# Patient Record
Sex: Male | Born: 1985 | Race: White | Hispanic: No | Marital: Single | Smoking: Current every day smoker
Health system: Southern US, Community
[De-identification: ages and names within clinical notes are randomized; demographics above are authoritative.]

## PROBLEM LIST (undated history)

## (undated) DIAGNOSIS — F329 Major depressive disorder, single episode, unspecified: Secondary | ICD-10-CM

## (undated) DIAGNOSIS — F1010 Alcohol abuse, uncomplicated: Secondary | ICD-10-CM

## (undated) DIAGNOSIS — M5136 Other intervertebral disc degeneration, lumbar region: Secondary | ICD-10-CM

## (undated) DIAGNOSIS — F909 Attention-deficit hyperactivity disorder, unspecified type: Secondary | ICD-10-CM

## (undated) DIAGNOSIS — T1491XA Suicide attempt, initial encounter: Secondary | ICD-10-CM

## (undated) DIAGNOSIS — F32A Depression, unspecified: Secondary | ICD-10-CM

## (undated) DIAGNOSIS — F319 Bipolar disorder, unspecified: Secondary | ICD-10-CM

## (undated) DIAGNOSIS — F1920 Other psychoactive substance dependence, uncomplicated: Secondary | ICD-10-CM

## (undated) DIAGNOSIS — F1910 Other psychoactive substance abuse, uncomplicated: Secondary | ICD-10-CM

---
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
## 2011-01-31 ENCOUNTER — Emergency Department (HOSPITAL_COMMUNITY)
Admission: EM | Admit: 2011-01-31 | Discharge: 2011-01-31 | Disposition: A | Discharge status: Home / Self Care | Payer: Self-pay | Attending: Emergency Medicine | Admitting: Emergency Medicine

## 2011-01-31 DIAGNOSIS — S01501A Unspecified open wound of lip, initial encounter: Secondary | ICD-10-CM | POA: Insufficient documentation

## 2011-01-31 DIAGNOSIS — W1809XA Striking against other object with subsequent fall, initial encounter: Secondary | ICD-10-CM | POA: Insufficient documentation

## 2012-02-14 ENCOUNTER — Encounter (HOSPITAL_COMMUNITY): Payer: Self-pay | Admitting: *Deleted

## 2012-02-14 ENCOUNTER — Emergency Department (HOSPITAL_COMMUNITY)
Admission: EM | Admit: 2012-02-14 | Discharge: 2012-02-14 | Disposition: A | Discharge status: Home / Self Care | Payer: Self-pay | Attending: Emergency Medicine | Admitting: Emergency Medicine

## 2012-02-14 ENCOUNTER — Emergency Department (HOSPITAL_COMMUNITY): Payer: Self-pay

## 2012-02-14 DIAGNOSIS — R059 Cough, unspecified: Secondary | ICD-10-CM | POA: Insufficient documentation

## 2012-02-14 DIAGNOSIS — R05 Cough: Secondary | ICD-10-CM | POA: Insufficient documentation

## 2012-02-14 DIAGNOSIS — R079 Chest pain, unspecified: Secondary | ICD-10-CM | POA: Insufficient documentation

## 2012-02-14 DIAGNOSIS — J069 Acute upper respiratory infection, unspecified: Secondary | ICD-10-CM | POA: Insufficient documentation

## 2012-02-14 DIAGNOSIS — R070 Pain in throat: Secondary | ICD-10-CM | POA: Insufficient documentation

## 2012-02-14 DIAGNOSIS — J3489 Other specified disorders of nose and nasal sinuses: Secondary | ICD-10-CM | POA: Insufficient documentation

## 2012-02-14 DIAGNOSIS — F17200 Nicotine dependence, unspecified, uncomplicated: Secondary | ICD-10-CM | POA: Insufficient documentation

## 2012-02-14 DIAGNOSIS — IMO0001 Reserved for inherently not codable concepts without codable children: Secondary | ICD-10-CM | POA: Insufficient documentation

## 2012-02-14 DIAGNOSIS — R509 Fever, unspecified: Secondary | ICD-10-CM | POA: Insufficient documentation

## 2012-02-14 MED ORDER — BENZONATATE 100 MG PO CAPS: 100.0000 mg | ORAL_CAPSULE | Freq: Three times a day (TID) | ORAL | Status: AC

## 2012-02-14 MED ORDER — HYDROCODONE-ACETAMINOPHEN 5-325 MG PO TABS: 1.0000 | ORAL_TABLET | Freq: Four times a day (QID) | ORAL | Status: AC | PRN

## 2012-02-14 MED ORDER — HYDROCODONE-ACETAMINOPHEN 5-325 MG PO TABS
1.0000 | ORAL_TABLET | Freq: Once | ORAL | Status: AC
  Administered 2012-02-14: 1 via ORAL
  Filled 2012-02-14: qty 1

## 2012-02-14 NOTE — ED Notes (Signed)
Pt reports dry heaves, no vomiting, bilateral abd pain describe as sore and tender, and productive cough, and fever x 3 days. Bowel sound present, pain not worsen with palpation, no distention, breath sound clear, denied chest pain.

## 2012-02-14 NOTE — Discharge Instructions (Signed)
Antibiotic Nonuse  Your caregiver felt that the infection or problem was not one that would be helped with an antibiotic. Infections may be caused by viruses or bacteria. Only a caregiver can tell which one of these is the likely cause of an illness. A cold is the most common cause of infection in both adults and children. A cold is a virus. Antibiotic treatment will have no effect on a viral infection. Viruses can lead to many lost days of work caring for sick children and many missed days of school. Children may catch as many as 10 "colds" or "flus" per year during which they can be tearful, cranky, and uncomfortable. The goal of treating a virus is aimed at keeping the ill person comfortable. Antibiotics are medications used to help the body fight bacterial infections. There are relatively few types of bacteria that cause infections but there are hundreds of viruses. While both viruses and bacteria cause infection they are very different types of germs. A viral infection will typically go away by itself within 7 to 10 days. Bacterial infections may spread or get worse without antibiotic treatment. Examples of bacterial infections are:  Sore throats (like strep throat or tonsillitis).   Infection in the lung (pneumonia).   Ear and skin infections.  Examples of viral infections are:  Colds or flus.   Most coughs and bronchitis.   Sore throats not caused by Strep.   Runny noses.  It is often best not to take an antibiotic when a viral infection is the cause of the problem. Antibiotics can kill off the helpful bacteria that we have inside our body and allow harmful bacteria to start growing. Antibiotics can cause side effects such as allergies, nausea, and diarrhea without helping to improve the symptoms of the viral infection. Additionally, repeated uses of antibiotics can cause bacteria inside of our body to become resistant. That resistance can be passed onto harmful bacterial. The next time  you have an infection it may be harder to treat if antibiotics are used when they are not needed. Not treating with antibiotics allows our own immune system to develop and take care of infections more efficiently. Also, antibiotics will work better for us when they are prescribed for bacterial infections. Treatments for a child that is ill may include:  Give extra fluids throughout the day to stay hydrated.   Get plenty of rest.   Only give your child over-the-counter or prescription medicines for pain, discomfort, or fever as directed by your caregiver.   The use of a cool mist humidifier may help stuffy noses.   Cold medications if suggested by your caregiver.  Your caregiver may decide to start you on an antibiotic if:  The problem you were seen for today continues for a longer length of time than expected.   You develop a secondary bacterial infection.  SEEK MEDICAL CARE IF:  Fever lasts longer than 5 days.   Symptoms continue to get worse after 5 to 7 days or become severe.   Difficulty in breathing develops.   Signs of dehydration develop (poor drinking, rare urinating, dark colored urine).   Changes in behavior or worsening tiredness (listlessness or lethargy).  Document Released: 01/04/2002 Document Revised: 10/15/2011 Document Reviewed: 07/03/2009 [employer] Patient Information 2012 [employer].Upper Respiratory Infection, Adult An upper respiratory infection (URI) is also sometimes known as the common cold. The upper respiratory tract includes the nose, sinuses, throat, trachea, and bronchi. Bronchi are the airways leading to the lungs. Most   people improve within 1 week, but symptoms can last up to 2 weeks. A residual cough may last even longer.  CAUSES Many different viruses can infect the tissues lining the upper respiratory tract. The tissues become irritated and inflamed and often become very moist. Mucus production is also common. A cold is contagious. You can easily  spread the virus to others by oral contact. This includes kissing, sharing a glass, coughing, or sneezing. Touching your mouth or nose and then touching a surface, which is then touched by another person, can also spread the virus. SYMPTOMS  Symptoms typically develop 1 to 3 days after you come in contact with a cold virus. Symptoms vary from person to person. They may include:  Runny nose.   Sneezing.   Nasal congestion.   Sinus irritation.   Sore throat.   Loss of voice (laryngitis).   Cough.   Fatigue.   Muscle aches.   Loss of appetite.   Headache.   Low-grade fever.  DIAGNOSIS  You might diagnose your own cold based on familiar symptoms, since most people get a cold 2 to 3 times a year. Your caregiver can confirm this based on your exam. Most importantly, your caregiver can check that your symptoms are not due to another disease such as strep throat, sinusitis, pneumonia, asthma, or epiglottitis. Blood tests, throat tests, and X-rays are not necessary to diagnose a common cold, but they may sometimes be helpful in excluding other more serious diseases. Your caregiver will decide if any further tests are required. RISKS AND COMPLICATIONS  You may be at risk for a more severe case of the common cold if you smoke cigarettes, have chronic heart disease (such as heart failure) or lung disease (such as asthma), or if you have a weakened immune system. The very young and very old are also at risk for more serious infections. Bacterial sinusitis, middle ear infections, and bacterial pneumonia can complicate the common cold. The common cold can worsen asthma and chronic obstructive pulmonary disease (COPD). Sometimes, these complications can require emergency medical care and may be life-threatening. PREVENTION  The best way to protect against getting a cold is to practice good hygiene. Avoid oral or hand contact with people with cold symptoms. Wash your hands often if contact occurs.  There is no clear evidence that vitamin C, vitamin E, echinacea, or exercise reduces the chance of developing a cold. However, it is always recommended to get plenty of rest and practice good nutrition. TREATMENT  Treatment is directed at relieving symptoms. There is no cure. Antibiotics are not effective, because the infection is caused by a virus, not by bacteria. Treatment may include:  Increased fluid intake. Sports drinks offer valuable electrolytes, sugars, and fluids.   Breathing heated mist or steam (vaporizer or shower).   Eating chicken soup or other clear broths, and maintaining good nutrition.   Getting plenty of rest.   Using gargles or lozenges for comfort.   Controlling fevers with ibuprofen or acetaminophen as directed by your caregiver.   Increasing usage of your inhaler if you have asthma.  Zinc gel and zinc lozenges, taken in the first 24 hours of the common cold, can shorten the duration and lessen the severity of symptoms. Pain medicines may help with fever, muscle aches, and throat pain. A variety of non-prescription medicines are available to treat congestion and runny nose. Your caregiver can make recommendations and may suggest nasal or lung inhalers for other symptoms.  HOME CARE INSTRUCTIONS     Only take over-the-counter or prescription medicines for pain, discomfort, or fever as directed by your caregiver.   Use a warm mist humidifier or inhale steam from a shower to increase air moisture. This may keep secretions moist and make it easier to breathe.   Drink enough water and fluids to keep your urine clear or pale yellow.   Rest as needed.   Return to work when your temperature has returned to normal or as your caregiver advises. You may need to stay home longer to avoid infecting others. You can also use a face mask and careful hand washing to prevent spread of the virus.  SEEK MEDICAL CARE IF:   After the first few days, you feel you are getting worse  rather than better.   You need your caregiver's advice about medicines to control symptoms.   You develop chills, worsening shortness of breath, or brown or red sputum. These may be signs of pneumonia.   You develop yellow or brown nasal discharge or pain in the face, especially when you bend forward. These may be signs of sinusitis.   You develop a fever, swollen neck glands, pain with swallowing, or white areas in the back of your throat. These may be signs of strep throat.  SEEK IMMEDIATE MEDICAL CARE IF:   You have a fever.   You develop severe or persistent headache, ear pain, sinus pain, or chest pain.   You develop wheezing, a prolonged cough, cough up blood, or have a change in your usual mucus (if you have chronic lung disease).   You develop sore muscles or a stiff neck.  Document Released: 04/21/2001 Document Revised: 10/15/2011 Document Reviewed: 02/27/2011 [employer] Patient Information 2012 [employer]. 

## 2012-02-14 NOTE — ED Notes (Signed)
Per pt reports two to three days ago started coughing, worse at night.  Pt reports tactile fever, chills, diaphoresis. Pt also reports throat feels swollen and sore.  Pt denies nausea or vomiting.  Pt denies history of asthma or allergies.

## 2012-02-14 NOTE — ED Provider Notes (Signed)
History     CSN: [id number]  Arrival date & time 02/14/12  0805   First MD Initiated Contact with Patient 02/14/12 [id number]      Chief Complaint  Patient presents with  . Cough  . Sore Throat  . Chills    (Consider location/radiation/quality/duration/timing/severity/associated sxs/prior treatment) Patient is a 26 y.o. male presenting with cough and pharyngitis. The history is provided by the patient.  Cough The current episode started more than 2 days ago. The problem occurs constantly. The problem has not changed since onset.The cough is non-productive. Maximum temperature: he felt warm. Associated symptoms include chest pain, chills, headaches, rhinorrhea and myalgias. The treatment provided no relief. He is a smoker. His past medical history does not include pneumonia or asthma.  Sore Throat Associated symptoms include chest pain and headaches.    History reviewed. No pertinent past medical history.  History reviewed. No pertinent past surgical history.  No family history on file.  History  Substance Use Topics  . Smoking status: Current Everyday Smoker  . Smokeless tobacco: Not on file  . Alcohol Use: 2.4 oz/week    4 Cans of beer per week      Review of Systems  Constitutional: Positive for chills.  HENT: Positive for rhinorrhea.   Respiratory: Positive for cough.   Cardiovascular: Positive for chest pain.  Musculoskeletal: Positive for myalgias.  Neurological: Positive for headaches.    Allergies  Aleve; Bee venom; and Poison ivy wash  Home Medications   Current Outpatient Rx  Name Route Sig Dispense Refill  . IBUPROFEN 200 MG PO TABS Oral Take 600 mg by mouth every 6 (six) hours as needed. For fever or pain.    Marland Kitchen PSEUDOEPH-DOXYLAMINE-DM-APAP 60-12.04-07-999 MG/30ML PO LIQD Oral Take by mouth.    . TETRAHYDROZOLINE HCL 0.05 % OP SOLN Both Eyes Place 1 drop into both eyes.      BP 129/69  Pulse 90  Temp(Src) 99 F (37.2 C) (Oral)  Resp 20  SpO2  100%  Physical Exam  Nursing note and vitals reviewed. Constitutional: He appears well-developed and well-nourished. No distress.  HENT:  Head: Normocephalic and atraumatic.  Right Ear: External ear normal.  Left Ear: External ear normal.  Mouth/Throat: Posterior oropharyngeal erythema present. No oropharyngeal exudate.  Eyes: Conjunctivae are normal. Right eye exhibits no discharge. Left eye exhibits no discharge. No scleral icterus.  Neck: Neck supple. No tracheal deviation present.  Cardiovascular: Normal rate, regular rhythm and intact distal pulses.   Pulmonary/Chest: Effort normal and breath sounds normal. No stridor. No respiratory distress. He has no wheezes. He has no rales.  Abdominal: Soft. Bowel sounds are normal. He exhibits no distension. There is no tenderness. There is no rebound and no guarding.  Musculoskeletal: He exhibits no edema and no tenderness.  Neurological: He is alert. He has normal strength. No sensory deficit. Cranial nerve deficit:  no gross defecits noted. He exhibits normal muscle tone. He displays no seizure activity. Coordination normal.  Skin: Skin is warm and dry. No rash noted.  Psychiatric: He has a normal mood and affect.    ED Course  Procedures (including critical care time)  Labs Reviewed - No data to display Dg Chest 2 View  02/14/2012  *RADIOLOGY REPORT*  Clinical Data:  Cough and fever.  CHEST - 2 VIEW  Comparison: None  Findings: The heart size and mediastinal contours are within normal limits.  Both lungs are clear.  The visualized skeletal structures are unremarkable.  IMPRESSION: No active  disease.  Original Report Authenticated By: Reola Calkins, M.D.     1. URI, acute       MDM  The patient's symptoms are suggestive of a viral upper respiratory infection. He has no evidence of pneumonia on x-ray. He feels better after taking hydrocodone tablet.  I will  discharge the patient home on medications for pain as well as a cough  suppressant.  He has been given instructions about respiratory infections and warning signs that require reevaluation       Celene Kras, MD 02/14/12 1142

## 2012-09-11 ENCOUNTER — Encounter (HOSPITAL_COMMUNITY): Payer: Self-pay

## 2012-09-11 ENCOUNTER — Emergency Department (HOSPITAL_COMMUNITY)
Admission: EM | Admit: 2012-09-11 | Discharge: 2012-09-11 | Disposition: A | Discharge status: Home / Self Care | Payer: Self-pay | Attending: Emergency Medicine | Admitting: Emergency Medicine

## 2012-09-11 DIAGNOSIS — F1910 Other psychoactive substance abuse, uncomplicated: Secondary | ICD-10-CM | POA: Insufficient documentation

## 2012-09-11 DIAGNOSIS — F329 Major depressive disorder, single episode, unspecified: Secondary | ICD-10-CM

## 2012-09-11 DIAGNOSIS — F3289 Other specified depressive episodes: Secondary | ICD-10-CM | POA: Insufficient documentation

## 2012-09-11 DIAGNOSIS — F17200 Nicotine dependence, unspecified, uncomplicated: Secondary | ICD-10-CM | POA: Insufficient documentation

## 2012-09-11 DIAGNOSIS — Z046 Encounter for general psychiatric examination, requested by authority: Secondary | ICD-10-CM | POA: Insufficient documentation

## 2012-09-11 HISTORY — DX: Depression, unspecified: F32.A

## 2012-09-11 HISTORY — DX: Major depressive disorder, single episode, unspecified: F32.9

## 2012-09-11 HISTORY — DX: Suicide attempt, initial encounter: T14.91XA

## 2012-09-11 HISTORY — DX: Alcohol abuse, uncomplicated: F10.10

## 2012-09-11 HISTORY — DX: Attention-deficit hyperactivity disorder, unspecified type: F90.9

## 2012-09-11 HISTORY — DX: Other intervertebral disc degeneration, lumbar region: M51.36

## 2012-09-11 LAB — BASIC METABOLIC PANEL
Chloride: 100 mEq/L (ref 96–112)
GFR calc non Af Amer: >90 mL/min (ref >90)
Glucose, Bld: 81 mg/dL (ref 70–99)
Potassium: 4.2 mEq/L (ref 3.5–5.1)
Sodium: 139 mEq/L (ref 135–145)

## 2012-09-11 LAB — RAPID URINE DRUG SCREEN, HOSP PERFORMED
Benzodiazepines: NOT DETECTED
Cocaine: POSITIVE — AB
Opiates: NOT DETECTED
Tetrahydrocannabinol: POSITIVE — AB

## 2012-09-11 LAB — CBC WITH DIFFERENTIAL/PLATELET
Eosinophils Absolute: 0.1 10*3/uL (ref 0.0–0.7)
Hemoglobin: 16.2 g/dL (ref 13.0–17.0)
Lymphocytes Relative: 28 % (ref 12–46)
Lymphs Abs: 2.2 10*3/uL (ref 0.7–4.0)
MCH: 34 pg (ref 26.0–34.0)
Neutro Abs: 4.5 10*3/uL (ref 1.7–7.7)
Neutrophils Relative %: 58 % (ref 43–77)
Platelets: 178 10*3/uL (ref 150–400)
RBC: 4.76 MIL/uL (ref 4.22–5.81)
WBC: 7.8 10*3/uL (ref 4.0–10.5)

## 2012-09-11 MED ORDER — MIRTAZAPINE 15 MG PO TABS: 15.0000 mg | ORAL_TABLET | Freq: Every day | ORAL | Status: AC

## 2012-09-11 MED ORDER — ACETAMINOPHEN 325 MG PO TABS: 650.0000 mg | ORAL_TABLET | ORAL | Status: DC | PRN

## 2012-09-11 MED ORDER — LORAZEPAM 1 MG PO TABS: 1.0000 mg | ORAL_TABLET | Freq: Three times a day (TID) | ORAL | Status: DC | PRN

## 2012-09-11 MED ORDER — ONDANSETRON HCL 4 MG PO TABS: 4.0000 mg | ORAL_TABLET | Freq: Three times a day (TID) | ORAL | Status: DC | PRN

## 2012-09-11 NOTE — ED Notes (Signed)
Pt still waiting for telepsych to be performed, dinner tray given, family at bedside, sitter remains with pt, spoke with telepsych who advised that pt was next case to be done, pt and family updated on delay.

## 2012-09-11 NOTE — ED Notes (Signed)
Ella (ACT) here to evaluate pt

## 2012-09-11 NOTE — ED Notes (Signed)
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  Depressed for years, suicide attempt recently "i just snapped", went to medicine cabinet and started taking stuff, didn't seek help, tried then to get clean and unable to do. Admits to si, plan is to overdose on xanax, called father and stated he "didn't want to be here anymore and needs help"

## 2012-09-11 NOTE — ED Provider Notes (Signed)
Patient presents to ED with complaints of substance abuse, suicidal ideation with plan to "end it all and take some pills and not wake up. "He states this would be better than putting a hole in his head. Given the statements patient needs tele psychiatry consult as he cannot be safely discharged.  Psychiatry consult complete. Patient denies SI, HI, intent, plan.  Willing to go to outpatient rehab and stable for discharge.  Glynn Octave, MD 09/11/12 2038

## 2012-09-11 NOTE — ED Provider Notes (Signed)
History   This chart was scribed for Carleene Cooper III, MD by Charolett Bumpers . The patient was seen in room APA15/APA15. Patient's care was started at 1257.   CSN: [id number]  Arrival date & time 09/11/12  1029   First MD Initiated Contact with Patient 09/11/12 1257      Chief Complaint  Patient presents with  . V70.1    The history is provided by the patient. No language interpreter was used.  Francisco Stanley is a 26 y.o. male who presents to the Emergency Department complaining of depression that has troubled him for years. He state he also needs help with detox from cocaine, marijuana and alcohol. He reports he relapsed last night and took several xanax. He called his father and stated he "didn't want to be here anymore and needs help". Pt denies any current SI or HI. He states he smokes 1-2 packs daily, if taking drugs he smokes 3-6 packs. He reports alcohol use. He states he started going to AA twice daily for about a week and then stopped. He has a h/o depression, ADHD, suicide attempt and alcohol abuse.   Past Medical History  Diagnosis Date  . Suicide attempt   . ADHD (attention deficit hyperactivity disorder)   . Depression   . DDD (degenerative disc disease), lumbar   . Alcohol abuse     History reviewed. No pertinent past surgical history.  No family history on file.  History  Substance Use Topics  . Smoking status: Current Every Day Smoker    Types: Cigarettes  . Smokeless tobacco: Not on file  . Alcohol Use: 2.4 oz/week    4 Cans of beer per week      Review of Systems  Constitutional: Negative for fever and chills.  Respiratory: Negative for shortness of breath.   Gastrointestinal: Negative for nausea and vomiting.  Neurological: Negative for weakness.  Psychiatric/Behavioral: Positive for suicidal ideas and dysphoric mood.  All other systems reviewed and are negative.    Allergies  Advil and Aleve  Home Medications   Current Outpatient Rx    Name  Route  Sig  Dispense  Refill  . ALPRAZOLAM 1 MG PO TABS   Oral   Take 1 mg by mouth 4 (four) times daily as needed. For with drawal symptoms         . TETRAHYDROZOLINE HCL 0.05 % OP SOLN   Both Eyes   Place 1 drop into both eyes 4 (four) times daily as needed. For eye irritation.         Marland Kitchen OVER THE COUNTER MEDICATION   Oral   Take 30 mLs by mouth every 6 (six) hours as needed. Equate Nitetime Cold & Flu Relief Liquid. For cold or flu symptoms.           BP 134/76  Pulse 93  Temp 98.7 F (37.1 C) (Oral)  Resp 20  Ht 6' (1.829 m)  Wt 180 lb (81.647 kg)  BMI 24.41 kg/m2  SpO2 100%  Physical Exam  Nursing note and vitals reviewed. Constitutional: He is oriented to person, place, and time. He appears well-developed and well-nourished. No distress.  HENT:  Head: Normocephalic and atraumatic.  Right Ear: External ear normal.  Left Ear: External ear normal.  Nose: Nose normal.  Mouth/Throat: Oropharynx is clear and moist. No oropharyngeal exudate.  Eyes: EOM are normal. Pupils are equal, round, and reactive to light.  Neck: Normal range of motion. Neck supple. No tracheal deviation  present.  Cardiovascular: Normal rate, regular rhythm and normal heart sounds.   No murmur heard. Pulmonary/Chest: Effort normal and breath sounds normal. No respiratory distress. He has no wheezes.  Abdominal: Soft. Bowel sounds are normal. He exhibits no distension. There is no tenderness.  Musculoskeletal: Normal range of motion. He exhibits no edema.  Neurological: He is alert and oriented to person, place, and time. No cranial nerve deficit.  Skin: Skin is warm and dry.  Psychiatric: His behavior is normal. Thought content normal. He exhibits a depressed mood. He expresses no homicidal and no suicidal ideation.    ED Course  Procedures (including critical care time)  DIAGNOSTIC STUDIES: Oxygen Saturation is 100% on room air, normal by my interpretation.    COORDINATION OF  CARE:  13:15-Discussed planned course of treatment with the patient including consultation with the ACT team, who is agreeable at this time. Waiting on lab results.     Results for orders placed during the hospital encounter of 09/11/12  CBC WITH DIFFERENTIAL      Component Value Range   WBC 7.8  4.0 - 10.5 K/uL   RBC 4.76  4.22 - 5.81 MIL/uL   Hemoglobin 16.2  13.0 - 17.0 g/dL   HCT 16.1  09.6 - 04.5 %   MCV 94.5  78.0 - 100.0 fL   MCH 34.0  26.0 - 34.0 pg   MCHC 36.0  30.0 - 36.0 g/dL   RDW 40.9  81.1 - 91.4 %   Platelets 178  150 - 400 K/uL   Neutrophils Relative 58  43 - 77 %   Neutro Abs 4.5  1.7 - 7.7 K/uL   Lymphocytes Relative 28  12 - 46 %   Lymphs Abs 2.2  0.7 - 4.0 K/uL   Monocytes Relative 12  3 - 12 %   Monocytes Absolute 0.9  0.1 - 1.0 K/uL   Eosinophils Relative 2  0 - 5 %   Eosinophils Absolute 0.1  0.0 - 0.7 K/uL   Basophils Relative 0  0 - 1 %   Basophils Absolute 0.0  0.0 - 0.1 K/uL  BASIC METABOLIC PANEL      Component Value Range   Sodium 139  135 - 145 mEq/L   Potassium 4.2  3.5 - 5.1 mEq/L   Chloride 100  96 - 112 mEq/L   CO2 29  19 - 32 mEq/L   Glucose, Bld 81  70 - 99 mg/dL   BUN 8  6 - 23 mg/dL   Creatinine, Ser 7.82  0.50 - 1.35 mg/dL   Calcium 95.6  8.4 - 21.3 mg/dL   GFR calc non Af Amer >90  >90 mL/min   GFR calc Af Amer >90  >90 mL/min  ETHANOL      Component Value Range   Alcohol, Ethyl (B) <11  0 - 11 mg/dL  URINE RAPID DRUG SCREEN (HOSP PERFORMED)      Component Value Range   Opiates NONE DETECTED  NONE DETECTED   Cocaine POSITIVE (*) NONE DETECTED   Benzodiazepines NONE DETECTED  NONE DETECTED   Amphetamines NONE DETECTED  NONE DETECTED   Tetrahydrocannabinol POSITIVE (*) NONE DETECTED   Barbiturates NONE DETECTED  NONE DETECTED  SALICYLATE LEVEL      Component Value Range   Salicylate Lvl <2.0 (*) 2.8 - 20.0 mg/dL  ACETAMINOPHEN LEVEL      Component Value Range   Acetaminophen (Tylenol), Serum <15.0  10 - 30 ug/mL  1.  Substance abuse   2. Depression     Pt had telepsychiatry consultations, was not found to be suicidal, and was given out patient referrals for treatment of substance abuse.    I personally performed the services described in this documentation, which was scribed in my presence. The recorded information has been reviewed and is accurate.  Osvaldo Human, M.D.      Carleene Cooper III, MD 09/20/12 [id number]

## 2012-09-11 NOTE — ED Notes (Signed)
Pt states "I want to end it all, I am tired of fucking living, I have problems with addiction", pt admits to SI with a plan, when asked what the plan was " pt states I already told the other person", sitter placed at bedside, comfort measures provided,

## 2012-09-11 NOTE — ED Notes (Signed)
Pt lying supine on stretcher, resp even and non labored, sitter remains at bedside,

## 2012-09-11 NOTE — ED Notes (Signed)
Pt sitting in bed, watching tv, sitter remains at bedside,

## 2012-09-11 NOTE — ED Notes (Signed)
Telepsych being requested by Dr. Manus Gunning, telepsych computer placed in pt's room
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
## 2012-09-11 NOTE — ED Notes (Signed)
telepsych being performed at present time 

## 2012-09-11 NOTE — ED Notes (Signed)
Ella (ACT) in room talking with pt  
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
## 2012-09-11 NOTE — ED Notes (Signed)
Tele psych completed at this time

## 2019-08-10 ENCOUNTER — Other Ambulatory Visit: Payer: Self-pay

## 2019-08-10 ENCOUNTER — Encounter (HOSPITAL_COMMUNITY): Payer: Self-pay | Admitting: Emergency Medicine

## 2019-08-10 ENCOUNTER — Emergency Department (HOSPITAL_COMMUNITY): Payer: No Typology Code available for payment source

## 2019-08-10 ENCOUNTER — Emergency Department (HOSPITAL_COMMUNITY)
Admission: EM | Admit: 2019-08-10 | Discharge: 2019-08-10 | Disposition: A | Discharge status: Home / Self Care | Payer: No Typology Code available for payment source | Attending: Emergency Medicine | Admitting: Emergency Medicine

## 2019-08-10 DIAGNOSIS — F17210 Nicotine dependence, cigarettes, uncomplicated: Secondary | ICD-10-CM | POA: Insufficient documentation

## 2019-08-10 DIAGNOSIS — R519 Headache, unspecified: Secondary | ICD-10-CM | POA: Insufficient documentation

## 2019-08-10 DIAGNOSIS — Z79899 Other long term (current) drug therapy: Secondary | ICD-10-CM | POA: Insufficient documentation

## 2019-08-10 DIAGNOSIS — Y9389 Activity, other specified: Secondary | ICD-10-CM | POA: Insufficient documentation

## 2019-08-10 DIAGNOSIS — R4182 Altered mental status, unspecified: Secondary | ICD-10-CM | POA: Diagnosis not present

## 2019-08-10 DIAGNOSIS — F909 Attention-deficit hyperactivity disorder, unspecified type: Secondary | ICD-10-CM | POA: Diagnosis not present

## 2019-08-10 DIAGNOSIS — Y92410 Unspecified street and highway as the place of occurrence of the external cause: Secondary | ICD-10-CM | POA: Diagnosis not present

## 2019-08-10 DIAGNOSIS — S3991XA Unspecified injury of abdomen, initial encounter: Secondary | ICD-10-CM | POA: Insufficient documentation

## 2019-08-10 DIAGNOSIS — Y999 Unspecified external cause status: Secondary | ICD-10-CM | POA: Diagnosis not present

## 2019-08-10 LAB — COMPREHENSIVE METABOLIC PANEL
ALT: 18 U/L (ref 0–44)
AST: 25 U/L (ref 15–41)
Albumin: 3.9 g/dL (ref 3.5–5.0)
Alkaline Phosphatase: 53 U/L (ref 38–126)
Anion gap: 7 (ref 5–15)
BUN: 6 mg/dL (ref 6–20)
CO2: 25 mmol/L (ref 22–32)
Calcium: 8.8 mg/dL — ABNORMAL LOW (ref 8.9–10.3)
Chloride: 106 mmol/L (ref 98–111)
Creatinine, Ser: 1.05 mg/dL (ref 0.61–1.24)
GFR calc Af Amer: >60 mL/min (ref >60)
GFR calc non Af Amer: >60 mL/min (ref >60)
Glucose, Bld: 97 mg/dL (ref 70–99)
Potassium: 3.5 mmol/L (ref 3.5–5.1)
Sodium: 138 mmol/L (ref 135–145)
Total Bilirubin: 0.9 mg/dL (ref 0.3–1.2)
Total Protein: 6.4 g/dL — ABNORMAL LOW (ref 6.5–8.1)

## 2019-08-10 LAB — PROTIME-INR
INR: 1.2 (ref 0.8–1.2)
Prothrombin Time: 14.8 seconds (ref 11.4–15.2)

## 2019-08-10 LAB — URINALYSIS, ROUTINE W REFLEX MICROSCOPIC
Bilirubin Urine: NEGATIVE
Glucose, UA: NEGATIVE mg/dL
Hgb urine dipstick: NEGATIVE
Ketones, ur: 20 mg/dL — AB
Leukocytes,Ua: NEGATIVE
Nitrite: NEGATIVE
Protein, ur: NEGATIVE mg/dL
Specific Gravity, Urine: 1.035 — ABNORMAL HIGH (ref 1.005–1.030)
pH: 7 (ref 5.0–8.0)

## 2019-08-10 LAB — RAPID URINE DRUG SCREEN, HOSP PERFORMED
Amphetamines: POSITIVE — AB
Barbiturates: NOT DETECTED
Benzodiazepines: NOT DETECTED
Cocaine: NOT DETECTED
Opiates: POSITIVE — AB
Tetrahydrocannabinol: POSITIVE — AB

## 2019-08-10 LAB — CDS SEROLOGY

## 2019-08-10 LAB — I-STAT CHEM 8, ED
BUN: 6 mg/dL (ref 6–20)
Calcium, Ion: 1.1 mmol/L — ABNORMAL LOW (ref 1.15–1.40)
Chloride: 105 mmol/L (ref 98–111)
Creatinine, Ser: 0.9 mg/dL (ref 0.61–1.24)
Glucose, Bld: 94 mg/dL (ref 70–99)
HCT: 43 % (ref 39.0–52.0)
Hemoglobin: 14.6 g/dL (ref 13.0–17.0)
Potassium: 3.4 mmol/L — ABNORMAL LOW (ref 3.5–5.1)
Sodium: 140 mmol/L (ref 135–145)
TCO2: 23 mmol/L (ref 22–32)

## 2019-08-10 LAB — CBC
HCT: 43.3 % (ref 39.0–52.0)
Hemoglobin: 14.8 g/dL (ref 13.0–17.0)
MCH: 32.7 pg (ref 26.0–34.0)
MCHC: 34.2 g/dL (ref 30.0–36.0)
MCV: 95.6 fL (ref 80.0–100.0)
Platelets: 226 10*3/uL (ref 150–400)
RBC: 4.53 MIL/uL (ref 4.22–5.81)
RDW: 11.9 % (ref 11.5–15.5)
WBC: 6.1 10*3/uL (ref 4.0–10.5)
nRBC: 0 % (ref 0.0–0.2)

## 2019-08-10 LAB — ETHANOL: Alcohol, Ethyl (B): <10 mg/dL (ref <10)

## 2019-08-10 LAB — SAMPLE TO BLOOD BANK

## 2019-08-10 LAB — LACTIC ACID, PLASMA: Lactic Acid, Venous: 0.9 mmol/L (ref 0.5–1.9)

## 2019-08-10 MED ORDER — IOHEXOL 300 MG/ML  SOLN
100.0000 mL | Freq: Once | INTRAMUSCULAR | Status: AC
  Administered 2019-08-10: 100 mL via INTRAVENOUS

## 2019-08-10 MED ORDER — ACETAMINOPHEN 325 MG PO TABS
650.0000 mg | ORAL_TABLET | Freq: Once | ORAL | Status: AC
  Administered 2019-08-10: 09:00:00 650 mg via ORAL
  Filled 2019-08-10: qty 2
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
## 2019-08-10 NOTE — Discharge Instructions (Signed)
Your CT of your Head/Neck/Chest/Abdomen/Pelvis were normal.  You will be sore for the next couple of days, may alternate tylenol or iburpofen.  Follow up with your primary care physician as needed.

## 2019-10-06 ENCOUNTER — Encounter (HOSPITAL_COMMUNITY): Payer: Self-pay

## 2019-10-06 ENCOUNTER — Emergency Department (HOSPITAL_COMMUNITY)
Admission: EM | Admit: 2019-10-06 | Discharge: 2019-10-06 | Disposition: A | Discharge status: Home / Self Care | Payer: Self-pay | Attending: Emergency Medicine | Admitting: Emergency Medicine

## 2019-10-06 ENCOUNTER — Emergency Department (HOSPITAL_COMMUNITY): Payer: Self-pay

## 2019-10-06 ENCOUNTER — Other Ambulatory Visit: Payer: Self-pay

## 2019-10-06 DIAGNOSIS — Y999 Unspecified external cause status: Secondary | ICD-10-CM | POA: Insufficient documentation

## 2019-10-06 DIAGNOSIS — F17210 Nicotine dependence, cigarettes, uncomplicated: Secondary | ICD-10-CM | POA: Insufficient documentation

## 2019-10-06 DIAGNOSIS — S0083XA Contusion of other part of head, initial encounter: Secondary | ICD-10-CM | POA: Insufficient documentation

## 2019-10-06 DIAGNOSIS — T07XXXA Unspecified multiple injuries, initial encounter: Secondary | ICD-10-CM

## 2019-10-06 DIAGNOSIS — F1110 Opioid abuse, uncomplicated: Secondary | ICD-10-CM | POA: Insufficient documentation

## 2019-10-06 DIAGNOSIS — S30811A Abrasion of abdominal wall, initial encounter: Secondary | ICD-10-CM | POA: Insufficient documentation

## 2019-10-06 DIAGNOSIS — W1789XA Other fall from one level to another, initial encounter: Secondary | ICD-10-CM | POA: Insufficient documentation

## 2019-10-06 DIAGNOSIS — Y929 Unspecified place or not applicable: Secondary | ICD-10-CM | POA: Insufficient documentation

## 2019-10-06 DIAGNOSIS — Y9339 Activity, other involving climbing, rappelling and jumping off: Secondary | ICD-10-CM | POA: Insufficient documentation

## 2019-10-06 HISTORY — DX: Other psychoactive substance abuse, uncomplicated: F19.10

## 2019-10-06 HISTORY — DX: Depression, unspecified: F32.A

## 2019-10-06 HISTORY — DX: Other psychoactive substance dependence, uncomplicated: F19.20

## 2019-10-06 HISTORY — DX: Bipolar disorder, unspecified: F31.9

## 2019-10-06 NOTE — ED Notes (Signed)
CT complete

## 2019-10-06 NOTE — Discharge Instructions (Addendum)
Take Tylenol if needed for pain.  Avoid using heroin.

## 2019-10-06 NOTE — ED Provider Notes (Signed)
[facility] EMERGENCY DEPARTMENT Provider Note   CSN: [id number] Arrival date & time: 10/06/19  [id number]     History   Chief Complaint Chief Complaint  Patient presents with  . Medical Clearance    HPI Carlynn Purlric Saggese is a 33 y.o. male.     HPI Patient presents with law enforcement after being detained and arrested for shoplifting.  He was climbing a fence, and as he came off it, to the ground, his knee hit his nose, injuring it.  He complains of pain in his nose and his neck.  He feels like he is withdrawing from heroin, and states he uses it daily.  He also states he is hungry but has not eaten in 2 days.  States he also uses benzodiazepines intermittently.  He denies nausea, vomiting, blurred vision, weakness or dizziness.  He feels like he injured his abdomen when he was climbing a fence.  He noticed some scratches on it.  He denies abdominal pain or chest pain.  There are no other known modifying factors.   Past Medical History:  Diagnosis Date  . Bipolar 1 disorder (HCC)   . Depression   . Drug abuse (HCC)   . Polysubstance (excluding opioids) dependence (HCC)     There are no active problems to display for this patient.   History reviewed. No pertinent surgical history.      Home Medications    Prior to Admission medications   Not on File    Family History No family history on file.  Social History Social History   Tobacco Use  . Smoking status: Current Every Day Smoker    Packs/day: 1.00    Types: Cigarettes  Substance Use Topics  . Alcohol use: Not Currently  . Drug use: Yes    Types: Marijuana, Methamphetamines, Cocaine, IV    Comment: heroin, benzos     Allergies   Nsaids   Review of Systems Review of Systems  All other systems reviewed and are negative.    Physical Exam Updated Vital Signs BP 118/77 (BP Location: Right Arm)   Pulse 93   Temp 98 F (36.7 C) (Oral)   Resp 18   SpO2 96%   Physical Exam Vitals signs and nursing  note reviewed.  Constitutional:      General: He is not in acute distress.    Appearance: He is well-developed. He is not ill-appearing, toxic-appearing or diaphoretic.     Comments: Wearing handcuffs, in presence of law enforcement.  HENT:     Head: Normocephalic and atraumatic.     Comments: No visible scalp or cranium trauma.  No midface trauma or crepitation.    Right Ear: Tympanic membrane, ear canal and external ear normal.     Left Ear: Tympanic membrane, ear canal and external ear normal.     Ears:     Comments: No blood in auditory canals    Nose:     Comments: Nose swollen bilaterally, without external injury.  Dried blood in nares bilaterally.  No septal hematoma visualized.    Mouth/Throat:     Comments: No trismus.  No visible dental injury. Eyes:     Conjunctiva/sclera: Conjunctivae normal.     Pupils: Pupils are equal, round, and reactive to light.  Neck:     Musculoskeletal: Normal range of motion and neck supple.     Trachea: Phonation normal.  Cardiovascular:     Rate and Rhythm: Normal rate and regular rhythm.     Heart  sounds: Normal heart sounds.  Pulmonary:     Effort: Pulmonary effort is normal.     Breath sounds: Normal breath sounds.  Abdominal:     General: There is no distension.     Palpations: Abdomen is soft.     Tenderness: There is no abdominal tenderness.  Musculoskeletal: Normal range of motion.  Skin:    General: Skin is warm and dry.  Neurological:     Mental Status: He is alert and oriented to person, place, and time.     Cranial Nerves: No cranial nerve deficit.     Sensory: No sensory deficit.     Motor: No abnormal muscle tone.     Coordination: Coordination normal.  Psychiatric:        Mood and Affect: Mood normal.        Behavior: Behavior normal.        Thought Content: Thought content normal.        Judgment: Judgment normal.      ED Treatments / Results  Labs (all labs ordered are listed, but only abnormal results are  displayed) Labs Reviewed - No data to display  EKG None  Radiology Ct Head Wo Contrast  Result Date: 10/06/2019 CLINICAL DATA:  Fall, facial trauma EXAM: CT HEAD WITHOUT CONTRAST CT MAXILLOFACIAL WITHOUT CONTRAST CT CERVICAL SPINE WITHOUT CONTRAST TECHNIQUE: Multidetector CT imaging of the head, cervical spine, and maxillofacial structures were performed using the standard protocol without intravenous contrast. Multiplanar CT image reconstructions of the cervical spine and maxillofacial structures were also generated. COMPARISON:  August 10, 2019 head CT FINDINGS: CT HEAD FINDINGS Brain: There is no acute intracranial hemorrhage, mass effect, or edema. Gray-white differentiation is maintained. Ventricles and sulci are normal in size and configuration. There is no extra-axial fluid collection. Vascular: No acute finding. Skull: Calvarium is intact. Other: Mastoid air cells are clear. Mild paranasal sinus mucosal thickening. CT MAXILLOFACIAL FINDINGS Osseous: No acute facial fracture identified. Temporomandibular joints are unremarkable. Orbits: Unremarkable. Sinuses: Mild paranasal sinus mucosal thickening. Soft tissues: Paranasal soft tissue swelling. Small perforation of the nasal septum. CT CERVICAL SPINE FINDINGS Alignment: Maintained. Skull base and vertebrae: No acute fracture. Vertebral body heights are maintained. Soft tissues and spinal canal: No prevertebral fluid or swelling. No visible canal hematoma. Disc levels: Disc heights are preserved. No significant degenerative changes. Upper chest: No apical lung mass. Other: None. IMPRESSION: No of acute intracranial injury. Paranasal soft tissue swelling.  No displaced nasal bone fracture. No acute cervical spine fracture. Electronically Signed   By: Guadlupe Spanish M.D.   On: 10/06/2019 11:15   Ct Cervical Spine Wo Contrast  Result Date: 10/06/2019 CLINICAL DATA:  Fall, facial trauma EXAM: CT HEAD WITHOUT CONTRAST CT MAXILLOFACIAL WITHOUT  CONTRAST CT CERVICAL SPINE WITHOUT CONTRAST TECHNIQUE: Multidetector CT imaging of the head, cervical spine, and maxillofacial structures were performed using the standard protocol without intravenous contrast. Multiplanar CT image reconstructions of the cervical spine and maxillofacial structures were also generated. COMPARISON:  August 10, 2019 head CT FINDINGS: CT HEAD FINDINGS Brain: There is no acute intracranial hemorrhage, mass effect, or edema. Gray-white differentiation is maintained. Ventricles and sulci are normal in size and configuration. There is no extra-axial fluid collection. Vascular: No acute finding. Skull: Calvarium is intact. Other: Mastoid air cells are clear. Mild paranasal sinus mucosal thickening. CT MAXILLOFACIAL FINDINGS Osseous: No acute facial fracture identified. Temporomandibular joints are unremarkable. Orbits: Unremarkable. Sinuses: Mild paranasal sinus mucosal thickening. Soft tissues: Paranasal soft tissue swelling. Small perforation  of the nasal septum. CT CERVICAL SPINE FINDINGS Alignment: Maintained. Skull base and vertebrae: No acute fracture. Vertebral body heights are maintained. Soft tissues and spinal canal: No prevertebral fluid or swelling. No visible canal hematoma. Disc levels: Disc heights are preserved. No significant degenerative changes. Upper chest: No apical lung mass. Other: None. IMPRESSION: No of acute intracranial injury. Paranasal soft tissue swelling.  No displaced nasal bone fracture. No acute cervical spine fracture. Electronically Signed   By: Macy Mis M.D.   On: 10/06/2019 11:15   Ct Maxillofacial Wo Cm  Result Date: 10/06/2019 CLINICAL DATA:  Fall, facial trauma EXAM: CT HEAD WITHOUT CONTRAST CT MAXILLOFACIAL WITHOUT CONTRAST CT CERVICAL SPINE WITHOUT CONTRAST TECHNIQUE: Multidetector CT imaging of the head, cervical spine, and maxillofacial structures were performed using the standard protocol without intravenous contrast. Multiplanar CT  image reconstructions of the cervical spine and maxillofacial structures were also generated. COMPARISON:  August 10, 2019 head CT FINDINGS: CT HEAD FINDINGS Brain: There is no acute intracranial hemorrhage, mass effect, or edema. Gray-white differentiation is maintained. Ventricles and sulci are normal in size and configuration. There is no extra-axial fluid collection. Vascular: No acute finding. Skull: Calvarium is intact. Other: Mastoid air cells are clear. Mild paranasal sinus mucosal thickening. CT MAXILLOFACIAL FINDINGS Osseous: No acute facial fracture identified. Temporomandibular joints are unremarkable. Orbits: Unremarkable. Sinuses: Mild paranasal sinus mucosal thickening. Soft tissues: Paranasal soft tissue swelling. Small perforation of the nasal septum. CT CERVICAL SPINE FINDINGS Alignment: Maintained. Skull base and vertebrae: No acute fracture. Vertebral body heights are maintained. Soft tissues and spinal canal: No prevertebral fluid or swelling. No visible canal hematoma. Disc levels: Disc heights are preserved. No significant degenerative changes. Upper chest: No apical lung mass. Other: None. IMPRESSION: No of acute intracranial injury. Paranasal soft tissue swelling.  No displaced nasal bone fracture. No acute cervical spine fracture. Electronically Signed   By: Macy Mis M.D.   On: 10/06/2019 11:15    Procedures Procedures (including critical care time)  Medications Ordered in ED Medications - No data to display   Initial Impression / Assessment and Plan / ED Course  I have reviewed the triage vital signs and the nursing notes.  Pertinent labs & imaging results that were available during my care of the patient were reviewed by me and considered in my medical decision making (see chart for details).  Clinical Course as of Oct 06 957  [facility] Oct 06, 2019  1242 Images reviewed and interpreted by radiology, no acute injuries.   [EW]  Sat Oct 07, 2019  0955 CT images  evaluated and interpreted by radiology all negative for acute abnormalities.   [EW]    Clinical Course User Index [EW] Daleen Bo, MD        Patient Vitals for the past 24 hrs:  BP Temp Temp src Pulse Resp SpO2  10/06/19 0930 118/77 98 F (36.7 C) Oral 93 18 96 %    At D/C- Reevaluation with update and discussion. After initial assessment and treatment, an updated evaluation reveals been stable.  No additional complaints.  Findings discussed and questions answered. Daleen Bo   Medical Decision Making: Contusion face, and abrasions abdomen, secondary to fall while climbing fence.  Patient currently in custody, he will be discharged with law enforcement.  Caide Campi was evaluated in Emergency Department on 10/07/2019 for the symptoms described in the history of present illness. He was evaluated in the context of the global COVID-19 pandemic, which necessitated consideration that the patient  might be at risk for infection with the SARS-CoV-2 virus that causes COVID-19. Institutional protocols and algorithms that pertain to the evaluation of patients at risk for COVID-19 are in a state of rapid change based on information released by regulatory bodies including the CDC and federal and state organizations. These policies and algorithms were followed during the patient's care in the ED.  CRITICAL CARE-no Performed by: Mancel Bale  Nursing Notes Reviewed/ Care Coordinated Applicable Imaging Reviewed Interpretation of Laboratory Data incorporated into ED treatment  The patient appears reasonably screened and/or stabilized for discharge and I doubt any other medical condition or other [facility] requiring further screening, evaluation, or treatment in the ED at this time prior to discharge.  Plan: Home Medications-OTC analgesia of choice; Home Treatments-cryotherapy if needed care to scratches on abdomen; return here if the recommended treatment, does not improve the symptoms; Recommended  follow up-PCP, as needed    Final Clinical Impressions(s) / ED Diagnoses   Final diagnoses:  Contusion, multiple sites  Abrasion, multiple sites  Heroin abuse [facility])    ED Discharge Orders    None       Mancel Bale, MD 10/07/19 [phone]
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
## 2020-04-23 ENCOUNTER — Other Ambulatory Visit: Payer: Self-pay

## 2020-04-23 ENCOUNTER — Emergency Department (HOSPITAL_COMMUNITY)
Admission: EM | Admit: 2020-04-23 | Discharge: 2020-04-23 | Payer: Self-pay | Attending: Emergency Medicine | Admitting: Emergency Medicine

## 2020-04-23 ENCOUNTER — Encounter (HOSPITAL_COMMUNITY): Payer: Self-pay

## 2020-04-23 DIAGNOSIS — Y9289 Other specified places as the place of occurrence of the external cause: Secondary | ICD-10-CM | POA: Insufficient documentation

## 2020-04-23 DIAGNOSIS — Y9389 Activity, other specified: Secondary | ICD-10-CM | POA: Insufficient documentation

## 2020-04-23 DIAGNOSIS — T22012A Burn of unspecified degree of left forearm, initial encounter: Secondary | ICD-10-CM | POA: Insufficient documentation

## 2020-04-23 DIAGNOSIS — X102XXA Contact with fats and cooking oils, initial encounter: Secondary | ICD-10-CM | POA: Insufficient documentation

## 2020-04-23 DIAGNOSIS — L237 Allergic contact dermatitis due to plants, except food: Secondary | ICD-10-CM | POA: Insufficient documentation

## 2020-04-23 DIAGNOSIS — Z23 Encounter for immunization: Secondary | ICD-10-CM | POA: Insufficient documentation

## 2020-04-23 DIAGNOSIS — Y998 Other external cause status: Secondary | ICD-10-CM | POA: Insufficient documentation

## 2020-04-23 DIAGNOSIS — T300 Burn of unspecified body region, unspecified degree: Secondary | ICD-10-CM

## 2020-04-23 DIAGNOSIS — F17210 Nicotine dependence, cigarettes, uncomplicated: Secondary | ICD-10-CM | POA: Insufficient documentation

## 2020-04-23 MED ORDER — BACITRACIN ZINC 500 UNIT/GM EX OINT
TOPICAL_OINTMENT | Freq: Two times a day (BID) | CUTANEOUS | Status: DC
  Administered 2020-04-23: 1 via TOPICAL
  Filled 2020-04-23: qty 0.9

## 2020-04-23 MED ORDER — TETANUS-DIPHTH-ACELL PERTUSSIS 5-2.5-18.5 LF-MCG/0.5 IM SUSP
0.5000 mL | Freq: Once | INTRAMUSCULAR | Status: AC
  Administered 2020-04-23: 0.5 mL via INTRAMUSCULAR
  Filled 2020-04-23: qty 0.5

## 2020-04-23 MED ORDER — PREDNISONE 20 MG PO TABS: 20.0000 mg | ORAL_TABLET | Freq: Every day | ORAL | 0 refills | Status: AC

## 2020-04-23 MED ORDER — HYDROCORTISONE 1 % EX CREA: TOPICAL_CREAM | CUTANEOUS | 0 refills | Status: DC

## 2020-04-23 MED ORDER — BACITRACIN ZINC 500 UNIT/GM EX OINT: 1.0000 "application " | TOPICAL_OINTMENT | Freq: Two times a day (BID) | CUTANEOUS | 0 refills | Status: DC

## 2020-04-23 NOTE — Discharge Instructions (Signed)
Take the prednisone for the poison ivy.  You may also use the hydrocortisone cream to your lower legs with the poison ivy is located  Apply the bacitracin ointment to your left forearm burn.  This will gradually heal over time.  We have updated your tetanus shot.
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
## 2020-06-02 ENCOUNTER — Inpatient Hospital Stay (HOSPITAL_COMMUNITY)
Admission: EM | Admit: 2020-06-02 | Discharge: 2020-06-06 | DRG: 603 | Disposition: A | Discharge status: Home / Self Care | Payer: Self-pay | Attending: Internal Medicine | Admitting: Internal Medicine

## 2020-06-02 ENCOUNTER — Other Ambulatory Visit: Payer: Self-pay

## 2020-06-02 DIAGNOSIS — L0390 Cellulitis, unspecified: Secondary | ICD-10-CM

## 2020-06-02 DIAGNOSIS — Z20822 Contact with and (suspected) exposure to covid-19: Secondary | ICD-10-CM | POA: Diagnosis present

## 2020-06-02 DIAGNOSIS — Z79899 Other long term (current) drug therapy: Secondary | ICD-10-CM

## 2020-06-02 DIAGNOSIS — Z886 Allergy status to analgesic agent status: Secondary | ICD-10-CM

## 2020-06-02 DIAGNOSIS — L03119 Cellulitis of unspecified part of limb: Secondary | ICD-10-CM | POA: Diagnosis present

## 2020-06-02 DIAGNOSIS — A419 Sepsis, unspecified organism: Secondary | ICD-10-CM

## 2020-06-02 DIAGNOSIS — L259 Unspecified contact dermatitis, unspecified cause: Secondary | ICD-10-CM

## 2020-06-02 DIAGNOSIS — L0100 Impetigo, unspecified: Secondary | ICD-10-CM

## 2020-06-02 DIAGNOSIS — F319 Bipolar disorder, unspecified: Secondary | ICD-10-CM | POA: Diagnosis present

## 2020-06-02 DIAGNOSIS — L03116 Cellulitis of left lower limb: Principal | ICD-10-CM

## 2020-06-02 DIAGNOSIS — L237 Allergic contact dermatitis due to plants, except food: Secondary | ICD-10-CM

## 2020-06-02 DIAGNOSIS — F17210 Nicotine dependence, cigarettes, uncomplicated: Secondary | ICD-10-CM | POA: Diagnosis present

## 2020-06-02 LAB — CBC WITH DIFFERENTIAL/PLATELET
Abs Immature Granulocytes: 0.06 10*3/uL (ref 0.00–0.07)
Basophils Absolute: 0 10*3/uL (ref 0.0–0.1)
Basophils Relative: 0 %
Eosinophils Absolute: 0.1 10*3/uL (ref 0.0–0.5)
Eosinophils Relative: 1 %
HCT: 41.3 % (ref 39.0–52.0)
Hemoglobin: 14.5 g/dL (ref 13.0–17.0)
Immature Granulocytes: 1 %
Lymphocytes Relative: 10 %
Lymphs Abs: 1 10*3/uL (ref 0.7–4.0)
MCH: 34.4 pg — ABNORMAL HIGH (ref 26.0–34.0)
MCHC: 35.1 g/dL (ref 30.0–36.0)
MCV: 97.9 fL (ref 80.0–100.0)
Monocytes Absolute: 1 10*3/uL (ref 0.1–1.0)
Monocytes Relative: 10 %
Neutro Abs: 7.7 10*3/uL (ref 1.7–7.7)
Neutrophils Relative %: 78 %
Platelets: 196 10*3/uL (ref 150–400)
RBC: 4.22 MIL/uL (ref 4.22–5.81)
RDW: 12.3 % (ref 11.5–15.5)
WBC: 9.9 10*3/uL (ref 4.0–10.5)
nRBC: 0 % (ref 0.0–0.2)

## 2020-06-02 LAB — BASIC METABOLIC PANEL
Anion gap: 10 (ref 5–15)
BUN: 11 mg/dL (ref 6–20)
CO2: 26 mmol/L (ref 22–32)
Calcium: 8.6 mg/dL — ABNORMAL LOW (ref 8.9–10.3)
Chloride: 98 mmol/L (ref 98–111)
Creatinine, Ser: 0.94 mg/dL (ref 0.61–1.24)
GFR calc Af Amer: >60 mL/min (ref >60)
GFR calc non Af Amer: >60 mL/min (ref >60)
Glucose, Bld: 104 mg/dL — ABNORMAL HIGH (ref 70–99)
Potassium: 4 mmol/L (ref 3.5–5.1)
Sodium: 134 mmol/L — ABNORMAL LOW (ref 135–145)

## 2020-06-02 LAB — SARS CORONAVIRUS 2 BY RT PCR (HOSPITAL ORDER, PERFORMED IN ~~LOC~~ HOSPITAL LAB): SARS Coronavirus 2: NEGATIVE

## 2020-06-02 MED ORDER — METHYLPREDNISOLONE SODIUM SUCC 40 MG IJ SOLR
40.0000 mg | Freq: Three times a day (TID) | INTRAMUSCULAR | Status: DC
  Administered 2020-06-03 – 2020-06-04 (×4): 40 mg via INTRAVENOUS
  Filled 2020-06-02 (×4): qty 1

## 2020-06-02 MED ORDER — DIPHENHYDRAMINE HCL 25 MG PO CAPS: 25.0000 mg | ORAL_CAPSULE | ORAL | Status: DC | PRN

## 2020-06-02 MED ORDER — ENOXAPARIN SODIUM 40 MG/0.4ML ~~LOC~~ SOLN
40.0000 mg | SUBCUTANEOUS | Status: DC
  Administered 2020-06-03 – 2020-06-06 (×4): 40 mg via SUBCUTANEOUS
  Filled 2020-06-02 (×4): qty 0.4

## 2020-06-02 MED ORDER — PIPERACILLIN-TAZOBACTAM 3.375 G IVPB 30 MIN
3.3750 g | Freq: Once | INTRAVENOUS | Status: AC
  Administered 2020-06-02: 3.375 g via INTRAVENOUS
  Filled 2020-06-02: qty 50

## 2020-06-02 MED ORDER — MORPHINE SULFATE (PF) 4 MG/ML IV SOLN
6.0000 mg | Freq: Once | INTRAVENOUS | Status: AC
  Administered 2020-06-02: 6 mg via INTRAVENOUS
  Filled 2020-06-02: qty 2

## 2020-06-02 MED ORDER — ACETAMINOPHEN 325 MG PO TABS
650.0000 mg | ORAL_TABLET | Freq: Four times a day (QID) | ORAL | Status: DC | PRN
  Administered 2020-06-03 – 2020-06-06 (×7): 650 mg via ORAL
  Filled 2020-06-02 (×7): qty 2

## 2020-06-02 MED ORDER — SODIUM CHLORIDE 0.9 % IV SOLN: INTRAVENOUS | Status: DC

## 2020-06-02 MED ORDER — VANCOMYCIN HCL 1500 MG/300ML IV SOLN
1500.0000 mg | Freq: Once | INTRAVENOUS | Status: AC
  Administered 2020-06-02: 1500 mg via INTRAVENOUS
  Filled 2020-06-02: qty 300

## 2020-06-02 MED ORDER — ONDANSETRON HCL 4 MG/2ML IJ SOLN: 4.0000 mg | Freq: Four times a day (QID) | INTRAMUSCULAR | Status: DC | PRN

## 2020-06-02 MED ORDER — METHYLPREDNISOLONE SODIUM SUCC 125 MG IJ SOLR
125.0000 mg | Freq: Once | INTRAMUSCULAR | Status: AC
  Administered 2020-06-02: 125 mg via INTRAVENOUS
  Filled 2020-06-02: qty 2

## 2020-06-02 MED ORDER — VANCOMYCIN HCL IN DEXTROSE 1-5 GM/200ML-% IV SOLN: 1000.0000 mg | Freq: Once | INTRAVENOUS | Status: DC

## 2020-06-02 MED ORDER — ACETAMINOPHEN 650 MG RE SUPP: 650.0000 mg | Freq: Four times a day (QID) | RECTAL | Status: DC | PRN

## 2020-06-02 MED ORDER — MAGNESIUM HYDROXIDE 400 MG/5ML PO SUSP: 30.0000 mL | Freq: Every day | ORAL | Status: DC | PRN

## 2020-06-02 MED ORDER — TRAZODONE HCL 50 MG PO TABS
25.0000 mg | ORAL_TABLET | Freq: Every evening | ORAL | Status: DC | PRN
  Administered 2020-06-04: 25 mg via ORAL
  Filled 2020-06-02: qty 1

## 2020-06-02 MED ORDER — ACETAMINOPHEN 325 MG PO TABS
650.0000 mg | ORAL_TABLET | Freq: Once | ORAL | Status: AC
  Administered 2020-06-02: 650 mg via ORAL
  Filled 2020-06-02: qty 2

## 2020-06-02 MED ORDER — METHYLPREDNISOLONE SODIUM SUCC 40 MG IJ SOLR: 40.0000 mg | Freq: Three times a day (TID) | INTRAMUSCULAR | Status: DC

## 2020-06-02 MED ORDER — ONDANSETRON HCL 4 MG PO TABS: 4.0000 mg | ORAL_TABLET | Freq: Four times a day (QID) | ORAL | Status: DC | PRN

## 2020-06-02 NOTE — ED Provider Notes (Signed)
[facility]-EMERGENCY DEPT Provider Note   CSN: [id number] Arrival date & time: 06/02/20  1843     History Chief Complaint  Patient presents with  . Wound Infection    Blythe Hartshorn is a 34 y.o. male.  HPI   34yM with painful rash, primarily to LLE. He thinks he got poison ivy ~5d ago. Itchy red rash. Has been applying chamomile lotion and using oatmeal baths. In past 2 days has had increasing pain, swelling and drainage from the back of his L leg. Denies fever. Feels achy. Rash to upper extremities as well but he feels like these areas have been improving.   Past Medical History:  Diagnosis Date  . Bipolar 1 disorder (HCC)   . Depression   . Drug abuse (HCC)   . Polysubstance (excluding opioids) dependence (HCC)    There are no problems to display for this patient.  No past surgical history on file.    No family history on file.  Social History   Tobacco Use  . Smoking status: Current Every Day Smoker    Packs/day: 1.00    Types: Cigarettes  Substance Use Topics  . Alcohol use: Not Currently  . Drug use: Yes    Types: Marijuana, Methamphetamines, Cocaine, IV    Comment: heroin, benzos    Home Medications Prior to Admission medications   Medication Sig Start Date End Date Taking? Authorizing Provider  bacitracin ointment Apply 1 application topically 2 (two) times daily. Apply to left forearm burn 04/23/20   Henderly, Britni A, PA-C  hydrocortisone cream 1 % Apply to affected area (Lower legs) 2 times daily 04/23/20   Henderly, Britni A, PA-C    Allergies    Nsaids  Review of Systems   Review of Systems All systems reviewed and negative, other than as noted in HPI.  Physical Exam Updated Vital Signs BP (!) 118/64 (BP Location: Left Arm)   Pulse (!) 115   Temp (!) 101.8 F (38.8 C) (Oral)   Resp 16   SpO2 97%   Physical Exam Vitals and nursing note reviewed.  Constitutional:      General: He is not in acute distress.     Appearance: He is well-developed.  HENT:     Head: Normocephalic and atraumatic.  Eyes:     General:        Right eye: No discharge.        Left eye: No discharge.     Conjunctiva/sclera: Conjunctivae normal.  Cardiovascular:     Rate and Rhythm: Normal rate and regular rhythm.     Heart sounds: Normal heart sounds. No murmur heard.  No friction rub. No gallop.   Pulmonary:     Effort: Pulmonary effort is normal. No respiratory distress.     Breath sounds: Normal breath sounds.  Abdominal:     General: There is no distension.     Palpations: Abdomen is soft.     Tenderness: There is no abdominal tenderness.  Musculoskeletal:        General: No tenderness.     Cervical back: Neck supple.  Skin:    Findings: Rash present.     Comments: Scattered erythematous papules and excoriated areas to extremities and LLE swollen with large area of confluent pustules to posterior thigh and clad as pictures. Serous and purulent drainage. Surrounding cellulitis.   Neurological:     Mental Status: He is alert.  Psychiatric:        Behavior: Behavior normal.  Thought Content: Thought content normal.       ED Results / Procedures / Treatments   Labs (all labs ordered are listed, but only abnormal results are displayed) Labs Reviewed  SARS CORONAVIRUS 2 BY RT PCR (HOSPITAL ORDER, PERFORMED IN [facility])  CBC WITH DIFFERENTIAL/PLATELET  BASIC METABOLIC PANEL    EKG None  Radiology No results found.  Procedures Procedures (including critical care time)  Medications Ordered in ED Medications  acetaminophen (TYLENOL) tablet 650 mg (has no administration in time range)  morphine 4 MG/ML injection 6 mg (has no administration in time range)  vancomycin (VANCOREADY) IVPB 1500 mg/300 mL (has no administration in time range)    ED Course  I have reviewed the triage vital signs and the nursing notes.  Pertinent labs & imaging results that were available during my  care of the patient were reviewed by me and considered in my medical decision making (see chart for details).    MDM Rules/Calculators/A&P                          34yM with significant skin infection to LLE.  Sounds like this started out as poison ivy and now with superimposed cellulitis/impetigo.  Systemic symptoms.  Pretty extensive involvement of his leg.  Will admit for IV antibiotics. Final Clinical Impression(s) / ED Diagnoses Final diagnoses:  Cellulitis of left lower extremity    Rx / DC Orders ED Discharge Orders    None       Raeford Razor, MD 06/03/20 2316

## 2020-06-02 NOTE — Progress Notes (Signed)
Pharmacy Antibiotic Note  Gerald Harrison is a 34 y.o. male admitted on 06/02/2020 with cellulitis.  Pharmacy has been consulted for Vancomycin & Zosyn dosing.  Plan: Zosyn 3.375gm IV Q8h to be infused over 4hrs Vancomycin 1500mg  IV q12h Monitor renal function and cx data   Height: 6' (182.9 cm) Weight: 81.6 kg (180 lb) IBW/kg (Calculated) : 77.6  Temp (24hrs), Avg:101.8 F (38.8 C), Min:101.8 F (38.8 C), Max:101.8 F (38.8 C)  Recent Labs  Lab 06/02/20 2053  WBC 9.9  CREATININE 0.94    Estimated Creatinine Clearance: 123.8 mL/min (by C-G formula based on SCr of 0.94 mg/dL).    Allergies  Allergen Reactions  . Nsaids    Antimicrobials this admission: 7/25 Vanc >>  7/25 Zosyn >>   Dose adjustments this admission:  Microbiology results: 7/25 COVID negative  Thank you for allowing pharmacy to be a part of this patient's care.  8/25 PharmD, BCPS 06/02/2020 11:16 PM

## 2020-06-02 NOTE — ED Triage Notes (Signed)
Patient reports to the ER for infected wound that started as poison ivy. Patient is panting in pain and covered in red areas

## 2020-06-02 NOTE — H&P (Signed)
[place] [facility]   PATIENT NAME: Gerald Harrison    MR#:  [id number]  DATE OF BIRTH:  1986/01/28  DATE OF ADMISSION:  06/02/2020  PRIMARY CARE PHYSICIAN: Patient, No Pcp Per   REQUESTING/REFERRING PHYSICIAN: Raeford Razor, MD  CHIEF COMPLAINT:   Chief Complaint  Patient presents with  . Wound Infection    HISTORY OF PRESENT ILLNESS:  Gerald Harrison  is a 34 y.o. male with a remote history of bipolar 1 disorder and polysubstance abuse, including alcohol and cocaine but he quit and ongoing tobacco, who presented to the emergency room with acute onset of left leg swelling with associated erythema, tenderness and pain with pustular eruptions.  He was playing with his friends kids a few days ago and believes that he was exposed to poison ivy and remembers having a significant reaction to it as a child.  Next day he started having rash in his leg that he has scratched and the rash extended to his upper extremities well as his back with significant pruritus.  He developed fever and chills today and extension was up to 101.8 here.  He denied any nausea or vomiting or diarrhea.  No chest pain or dyspnea or cough or wheezing.  No new detergents, lotions, soaps or medications.  He stated that he does not take any medications and that he was diagnosed with bipolar disorder only when he was on drugs in the past that he discontinued.  Upon presentation to the emergency room, heart rate was 109 and later 115, temperature 101.8 and vital signs were otherwise within normal.  Labs revealed mild hyponatremia 134 and CBC was unremarkable.  COVID-19 PCR came back negative.  The patient was given 6 mg of IV morphine sulfate and p.o. Tylenol as well as IV vancomycin.  He will be admitted to a medical telemetry bed for further evaluation and management.  PAST MEDICAL HISTORY:   Past Medical History:  Diagnosis Date  . Bipolar 1 disorder (HCC)   . Depression   . Drug abuse (HCC)   .  Polysubstance (excluding opioids) dependence (HCC)     PAST SURGICAL HISTORY:  No past surgical history on file.  He denies any previous surgeries.  SOCIAL HISTORY:   Social History   Tobacco Use  . Smoking status: Current Every Day Smoker    Packs/day: 1.00    Types: Cigarettes  Substance Use Topics  . Alcohol use: Not Currently    FAMILY HISTORY:  Positive for breast cancer on his mother's side of the family.  DRUG ALLERGIES:   Allergies  Allergen Reactions  . Nsaids     REVIEW OF SYSTEMS:   ROS As per history of present illness. All pertinent systems were reviewed above. Constitutional, HEENT, cardiovascular, respiratory, GI, GU, musculoskeletal, neuro, psychiatric, endocrine, integumentary and hematologic systems were reviewed and are otherwise negative/unremarkable except for positive findings mentioned above in the HPI.   MEDICATIONS AT HOME:   Prior to Admission medications   Medication Sig Start Date End Date Taking? Authorizing Provider  bacitracin ointment Apply 1 application topically 2 (two) times daily. Apply to left forearm burn Patient not taking: Reported on 06/02/2020 04/23/20   Henderly, Britni A, PA-C  hydrocortisone cream 1 % Apply to affected area (Lower legs) 2 times daily Patient not taking: Reported on 06/02/2020 04/23/20   Henderly, Britni A, PA-C      VITAL SIGNS:  Blood pressure (!) 110/58, pulse 93, temperature (!) 101.8 F (38.8  C), temperature source Oral, resp. rate 19, height 6' (1.829 m), weight 81.6 kg, SpO2 100 %.  PHYSICAL EXAMINATION:  Physical Exam  GENERAL:  34 y.o.-year-old patient lying in the bed with no acute distress.  EYES: Pupils equal, round, reactive to light and accommodation. No scleral icterus. Extraocular muscles intact.  HEENT: Head atraumatic, normocephalic. Oropharynx and nasopharynx clear.  NECK:  Supple, no jugular venous distention. No thyroid enlargement, no tenderness.  LUNGS: Normal breath sounds  bilaterally, no wheezing, rales,rhonchi or crepitation. No use of accessory muscles of respiration.  CARDIOVASCULAR: Regular rate and rhythm, S1, S2 normal. No murmurs, rubs, or gallops.  ABDOMEN: Soft, nondistended, nontender. Bowel sounds present. No organomegaly or mass.  EXTREMITIES: Left lower extremity swelling with erythema and tenderness with no cyanosis, or clubbing.  NEUROLOGIC: Cranial nerves II through XII are intact. Muscle strength 5/5 in all extremities. Sensation intact. Gait not checked.  PSYCHIATRIC: The patient is alert and oriented x 3.  Normal affect and good eye contact. SKIN: Scattered erythematous eruption over his back and right arm with skin ulceration secondary to itching. Severe left leg pustular eruption extending to his popliteal area and posterior thigh with associated erythema and leg swelling with warmth and tenderness.  He has overlying honey crusting concerning for impetigo       LABORATORY PANEL:   CBC Recent Labs  Lab 06/02/20 2053  WBC 9.9  HGB 14.5  HCT 41.3  PLT 196   ------------------------------------------------------------------------------------------------------------------  Chemistries  Recent Labs  Lab 06/02/20 2053  NA 134*  K 4.0  CL 98  CO2 26  GLUCOSE 104*  BUN 11  CREATININE 0.94  CALCIUM 8.6*   ------------------------------------------------------------------------------------------------------------------  Cardiac Enzymes No results for input(s): TROPONINI in the last 168 hours. ------------------------------------------------------------------------------------------------------------------  RADIOLOGY:  No results found.    IMPRESSION AND PLAN:   1.  Severe purulent and nonpurulent left lower extremity cellulitis, likely bacterial superinfection from poison ivy eruption.  The patient has developing impetigo. -The patient will be admitted to a medical telemetry bed. -Pending wound and blood cultures will  empirically start him on IV vancomycin and Zosyn. -This can be later deescalated accordingly to cover staph and strep if no evidence for MRSA though it is highly suspicious given the severity of his eruption. -Wound care consult to be obtained. -Pain management and as needed Benadryl will be given.  2.  Allergic dermatitis due to Poison ivy exposure with scattered eruption all over his body. -The patient will be placed on aggressive management with IV Solu-Medrol especially given significant complications from pruritus. -The patient will need at least 2-week course of tapered steroids to avoid rebound dermatitis.  3.  Sepsis without severe sepsis or septic shock secondary to left lower extremity cellulitis, as manifested by fever and tachycardia. -The patient will be placed on above-mentioned IV antibiotics. -We will check lactic acid and procalcitonin levels. -He will be hydrated with IV normal saline. -We will follow blood and wound cultures.  4.  Ongoing tobacco abuse. -She was counseled for smoking cessation and will receive further counseling here.  5.  Remote history of polysubstance abuse. -He reported that he quit cocaine and alcohol long time ago.  6.  DVT prophylaxis. -Subcutaneous Lovenox    All the records are reviewed and case discussed with ED provider. The plan of care was discussed in details with the patient (and family). I answered all questions. The patient agreed to proceed with the above mentioned plan. Further management will depend upon hospital  course.   CODE STATUS: Full code  Status is: Inpatient  Remains inpatient appropriate because:Hemodynamically unstable, Ongoing diagnostic testing needed not appropriate for outpatient work up, Unsafe d/c plan, IV treatments appropriate due to intensity of illness or inability to take PO and Inpatient level of care appropriate due to severity of illness   Dispo: The patient is from: Home              Anticipated  d/c is to: Home              Anticipated d/c date is: 2 days              Patient currently is not medically stable to d/c.   TOTAL TIME TAKING CARE OF THIS PATIENT: 55 minutes.    Hannah Beat M.D on 06/02/2020 at 10:04 PM  [facility]   From 7 PM-7 AM, contact night-coverage [email or web address]  CC: Primary care physician; Patient, No Pcp Per   Note: This dictation was prepared with Dragon dictation along with smaller phrase technology. Any transcriptional typo errors that result from this process are unintentional.

## 2020-06-03 ENCOUNTER — Other Ambulatory Visit: Payer: Self-pay

## 2020-06-03 ENCOUNTER — Encounter (HOSPITAL_COMMUNITY): Payer: Self-pay | Admitting: Family Medicine

## 2020-06-03 DIAGNOSIS — L237 Allergic contact dermatitis due to plants, except food: Secondary | ICD-10-CM

## 2020-06-03 DIAGNOSIS — L259 Unspecified contact dermatitis, unspecified cause: Secondary | ICD-10-CM

## 2020-06-03 LAB — BASIC METABOLIC PANEL
Anion gap: 9 (ref 5–15)
BUN: 10 mg/dL (ref 6–20)
CO2: 27 mmol/L (ref 22–32)
Calcium: 8.5 mg/dL — ABNORMAL LOW (ref 8.9–10.3)
Chloride: 101 mmol/L (ref 98–111)
Creatinine, Ser: 0.94 mg/dL (ref 0.61–1.24)
GFR calc Af Amer: >60 mL/min (ref >60)
GFR calc non Af Amer: >60 mL/min (ref >60)
Glucose, Bld: 177 mg/dL — ABNORMAL HIGH (ref 70–99)
Potassium: 3.9 mmol/L (ref 3.5–5.1)
Sodium: 137 mmol/L (ref 135–145)

## 2020-06-03 LAB — CBC
HCT: 41.5 % (ref 39.0–52.0)
Hemoglobin: 14.3 g/dL (ref 13.0–17.0)
MCH: 33.9 pg (ref 26.0–34.0)
MCHC: 34.5 g/dL (ref 30.0–36.0)
MCV: 98.3 fL (ref 80.0–100.0)
Platelets: 196 10*3/uL (ref 150–400)
RBC: 4.22 MIL/uL (ref 4.22–5.81)
RDW: 12.3 % (ref 11.5–15.5)
WBC: 9 10*3/uL (ref 4.0–10.5)
nRBC: 0 % (ref 0.0–0.2)

## 2020-06-03 LAB — HIV ANTIBODY (ROUTINE TESTING W REFLEX): HIV Screen 4th Generation wRfx: NONREACTIVE

## 2020-06-03 MED ORDER — PIPERACILLIN-TAZOBACTAM 3.375 G IVPB
3.3750 g | Freq: Three times a day (TID) | INTRAVENOUS | Status: DC
  Administered 2020-06-03 – 2020-06-05 (×6): 3.375 g via INTRAVENOUS
  Filled 2020-06-03 (×6): qty 50

## 2020-06-03 MED ORDER — VANCOMYCIN HCL 1500 MG/300ML IV SOLN
1500.0000 mg | Freq: Two times a day (BID) | INTRAVENOUS | Status: DC
  Administered 2020-06-03 – 2020-06-05 (×5): 1500 mg via INTRAVENOUS
  Filled 2020-06-03 (×8): qty 300

## 2020-06-03 MED ORDER — SILVER SULFADIAZINE 1 % EX CREA
TOPICAL_CREAM | Freq: Two times a day (BID) | CUTANEOUS | Status: DC
  Filled 2020-06-03 (×3): qty 50

## 2020-06-03 MED ORDER — PIPERACILLIN-TAZOBACTAM 3.375 G IVPB
3.3750 g | Freq: Three times a day (TID) | INTRAVENOUS | Status: DC
  Administered 2020-06-03: 3.375 g via INTRAVENOUS
  Filled 2020-06-03: qty 50

## 2020-06-03 NOTE — Consult Note (Signed)
WOC Nurse Consult Note: Reason for Consult:Noted exposure to poison ivy one month ago and subsequent nonhealing and now infected lesions to left posterior leg and somewhat right lower extremity.  Back is affected in one small area.  Is on vancomycin for cellulitis.  Will implement topical treatment as well.  Wound type:infectious  Pressure Injury POA: NA Measurement:scattered crusted weeping lesions to posterior legs and back Wound KHV:[id number] and yellow Drainage (amount, consistency, odor) moderate serosanguinous no odor.  Periwound:warm and tender Dressing procedure/placement/frequency: Cleanse crusted scabbed lesions with NS and pat dry.  Apply silvadene cream to open wounds. Cover with dry gauze, kerlix and tape. Change twice daily.  Will not follow at this time.  Please re-consult if needed.  Maple Hudson MSN, RN, FNP-BC CWON Wound, Ostomy, Continence Nurse Pager [phone]

## 2020-06-03 NOTE — Progress Notes (Signed)
PROGRESS NOTE  Waldon Sheerin [id number] DOB: 12/21/1985 DOA: 06/02/2020 PCP: Patient, No Pcp Per  Brief History   34yom presented w/ severe rash and pain LLE after playing in woods recently. PMH severe reaction to poison ivy and seen in ED one month ago for poison ivy and burn on arm. Pt reports 04/2020 poison ivy resolved.  A & P  Severe purulent and nonpurulent left lower extremity cellulitis, likely bacterial superinfection from poison ivy eruption.  The patient has developing impetigo --no evidence of sepsis --will continue vancomycin and Zosyn pending cultures. Follow culture data (GPR and GNR), continue wound care per wound care RN --monitor for worsening  Allergic dermatitis due to Poison ivy exposure  --continue IV steroids. Benadryl as needed --oral steroid tape on discharge  PMH drug abuse --denies recent use  Disposition Plan:  Discussion: severe reaction to poison ivy w/ substantial superinfection. Needs IV abx and steroids and close monitoring of response to tx.   Status is: Inpatient  Remains inpatient appropriate because:IV treatments appropriate due to intensity of illness or inability to take PO   Dispo: The patient is from: Home              Anticipated d/c is to: Home              Anticipated d/c date is: 2 days              Patient currently is not medically stable to d/c.  DVT prophylaxis: enoxaparin (LOVENOX) injection 40 mg Start: 06/03/20 1000 Code Status: Full Family Communication: none  Brendia Sacks, MD  [facility] Direct contact: see [email or web address] (further directions at bottom of note if needed) 7PM-7AM contact night coverage as at bottom of note 06/03/2020, 2:17 PM  LOS: 1 day   Significant Hospital Events   . 7/25 admit   Consults:  . Wound care RN   Procedures:  .   Significant Diagnostic Tests:  Marland Kitchen    Micro Data:  . 7/25 left leg wound >   Antimicrobials:  . Vancomycin 7/25 > . Zosyn 7/25   Interval History/Subjective    Reports left leg pain, itching arm, back History confirmed from H&P. Was playing hide and seek w/ friend and his kids in the woods, thereafter symptoms began  Objective   Vitals:  Vitals:   06/03/20 1341 06/03/20 1400  BP: 110/85 125/71  Pulse: 72 69  Resp: 18 16  Temp:    SpO2: 99% 100%    Exam:  Constitutional:   . Appears calm and comfortable, non-toxic Respiratory:  . CTA bilaterally, no w/r/r.  . Respiratory effort normal.   Cardiovascular:  . RRR, no m/r/g . No LE extremity edema   Skin:  . Maculopapular rash on back and RUE, small in area . Rash on LLE as in pictures on H&P with erythema Psychiatric:  . Mental status o Mood, affect appropriate  I have personally reviewed the following:   Today's Data  . BMP and CBC unremarkable  Scheduled Meds: . enoxaparin (LOVENOX) injection  40 mg Subcutaneous Q24H  . methylPREDNISolone (SOLU-MEDROL) injection  40 mg Intravenous Q8H  . silver sulfADIAZINE   Topical BID   Continuous Infusions: . vancomycin Stopped (06/03/20 1251)    Principal Problem:   Lower extremity cellulitis Active Problems:   Contact dermatitis   Poison ivy dermatitis   LOS: 1 day   How to contact the [facility] Attending or Consulting provider 7A - 7P or covering provider during after hours 7P -  7A, for this patient?  1. Check the care team in [facility] and look for a) attending/consulting TRH provider listed and b) the [facility] team listed 2. Log into [email or web address] and use [place]'s universal password to access. If you do not have the password, please contact the hospital operator. 3. Locate the [facility] provider you are looking for under [facility] and page to a number that you can be directly reached. 4. If you still have difficulty reaching the provider, please page the [facility] (Director on Call) for the Hospitalists listed on amion for assistance.

## 2020-06-04 MED ORDER — METHYLPREDNISOLONE SODIUM SUCC 40 MG IJ SOLR: 40.0000 mg | Freq: Two times a day (BID) | INTRAMUSCULAR | Status: DC

## 2020-06-04 NOTE — Progress Notes (Signed)
PROGRESS NOTE  Jermon Chalfant WFU:[id number] DOB: Jul 09, 1986 DOA: 06/02/2020 PCP: Patient, No Pcp Per  Brief History   34yom presented w/ severe rash and pain LLE after playing in woods recently. PMH severe reaction to poison ivy and seen in ED one month ago for poison ivy and burn on arm. Pt reports 04/2020 poison ivy resolved.  A & P  Severe purulent and nonpurulent left lower extremity cellulitis, likely bacterial superinfection from poison ivy eruption.  The patient has developing impetigo. No evidence of sepsis --improved but still significant LLE involvement. Will continue vancomycin and Zosyn pending gram stain. Staph aureus identified. Follow-up culture data (GPR and GNR), continue wound care per wound care RN  Allergic dermatitis due to Poison ivy exposure  --improving, continue IV steroids today, can probably change to oral in AM. Benadryl as needed --oral steroid tape on discharge  PMH drug abuse --denies recent use  Disposition Plan:  Discussion: severe reaction to poison ivy w/ substantial superinfection. Improving, but continues to need IV abx and steroids and close monitoring of response to tx.   Status is: Inpatient  Remains inpatient appropriate because:IV treatments appropriate due to intensity of illness or inability to take PO   Dispo: The patient is from: Home              Anticipated d/c is to: Home              Anticipated d/c date is: 2 days              Patient currently is not medically stable to d/c.  DVT prophylaxis: enoxaparin (LOVENOX) injection 40 mg Start: 06/03/20 1000 Code Status: Full Family Communication: none  Brendia Sacks, MD  [facility] Direct contact: see [email or web address] (further directions at bottom of note if needed) 7PM-7AM contact night coverage as at bottom of note 06/04/2020, 4:08 PM  LOS: 2 days   Significant Hospital Events   . 7/25 admit   Consults:  . Wound care RN   Procedures:  .   Significant Diagnostic Tests:   Marland Kitchen    Micro Data:  . 7/25 left leg wound >   Antimicrobials:  . Vancomycin 7/25 > . Zosyn 7/25 >   Interval History/Subjective  No change in pain but swelling going down LLE Back and RUE w/o change No new areas  Objective   Vitals:  Vitals:   06/04/20 0405 06/04/20 1340  BP: 119/71 117/71  Pulse: 76 85  Resp: 18 18  Temp: 98.4 F (36.9 C) 97.9 F (36.6 C)  SpO2: 97% 97%    Exam: Constitutional:   . Appears calm and comfortable Respiratory:  . CTA bilaterally, no w/r/r.  . Respiratory effort normal.  Cardiovascular:  . RRR, no m/r/g . No left foot edema   Skin:  . Rash on right arm and back drying up, no new lesions . Rash LLE drying up, crusting gone, new skin visible Psychiatric:  . Mental status o Mood, affect appropriate  I have personally reviewed the following:   Today's Data  . No new data  Scheduled Meds: . enoxaparin (LOVENOX) injection  40 mg Subcutaneous Q24H  . [START ON 06/05/2020] methylPREDNISolone (SOLU-MEDROL) injection  40 mg Intravenous Q12H  . silver sulfADIAZINE   Topical BID   Continuous Infusions: . piperacillin-tazobactam (ZOSYN)  IV 3.375 g (06/04/20 0620)  . vancomycin 1,500 mg (06/04/20 1015)    Principal Problem:   Lower extremity cellulitis Active Problems:   Contact dermatitis   Poison ivy  dermatitis   LOS: 2 days   How to contact the [facility] Attending or Consulting provider 7A - 7P or covering provider during after hours 7P -7A, for this patient?  1. Check the care team in [facility] and look for a) attending/consulting TRH provider listed and b) the [facility] team listed 2. Log into [email or web address] and use [place]'s universal password to access. If you do not have the password, please contact the hospital operator. 3. Locate the [facility] provider you are looking for under [facility] and page to a number that you can be directly reached. 4. If you still have difficulty reaching the provider, please page the [facility] (Director on  Call) for the Hospitalists listed on amion for assistance.

## 2020-06-05 LAB — BASIC METABOLIC PANEL WITH GFR
Anion gap: 8 (ref 5–15)
BUN: 11 mg/dL (ref 6–20)
CO2: 26 mmol/L (ref 22–32)
Calcium: 8.4 mg/dL — ABNORMAL LOW (ref 8.9–10.3)
Chloride: 107 mmol/L (ref 98–111)
Creatinine, Ser: 0.77 mg/dL (ref 0.61–1.24)
GFR calc Af Amer: >60 mL/min (ref >60)
GFR calc non Af Amer: >60 mL/min (ref >60)
Glucose, Bld: 135 mg/dL — ABNORMAL HIGH (ref 70–99)
Potassium: 3.3 mmol/L — ABNORMAL LOW (ref 3.5–5.1)
Sodium: 141 mmol/L (ref 135–145)

## 2020-06-05 MED ORDER — CEFDINIR 300 MG PO CAPS
300.0000 mg | ORAL_CAPSULE | Freq: Two times a day (BID) | ORAL | Status: DC
  Administered 2020-06-05 – 2020-06-06 (×3): 300 mg via ORAL
  Filled 2020-06-05 (×3): qty 1

## 2020-06-05 MED ORDER — DOXYCYCLINE HYCLATE 100 MG PO TABS
100.0000 mg | ORAL_TABLET | Freq: Two times a day (BID) | ORAL | Status: DC
  Administered 2020-06-05 – 2020-06-06 (×3): 100 mg via ORAL
  Filled 2020-06-05 (×3): qty 1

## 2020-06-05 MED ORDER — PREDNISONE 20 MG PO TABS
50.0000 mg | ORAL_TABLET | Freq: Every day | ORAL | Status: DC
  Administered 2020-06-06: 50 mg via ORAL
  Filled 2020-06-05: qty 2

## 2020-06-05 MED ORDER — POTASSIUM CHLORIDE CRYS ER 20 MEQ PO TBCR
40.0000 meq | EXTENDED_RELEASE_TABLET | Freq: Once | ORAL | Status: AC
  Administered 2020-06-05: 40 meq via ORAL
  Filled 2020-06-05: qty 2

## 2020-06-05 NOTE — Progress Notes (Signed)
PROGRESS NOTE  Gerald Harrison GGY:[id number] DOB: Aug 02, 1986 DOA: 06/02/2020 PCP: Patient, No Pcp Per  Brief History   34yom presented w/ severe rash and pain LLE after playing in woods recently. PMH severe reaction to poison ivy and seen in ED one month ago for poison ivy and burn on arm. Pt reports 04/2020 poison ivy resolved.  A & P  Severe purulent and nonpurulent left lower extremity cellulitis, likely bacterial superinfection from poison ivy eruption. MRSA in the wound.  The patient has developing impetigo. No evidence of sepsis --improved but still significant LLE involvement.  Treated with IV vancomycin and Zosyn. MRSA growing in the superficial culture. We will transition to oral doxycycline. Patient will require ongoing wound care outside of the hospital. Cleanse crusted scabbed lesions with NS and pat dry.  Apply silvadene cream to open wounds. Cover with dry gauze, kerlix and tape. Change twice daily  Allergic dermatitis due to Poison ivy exposure  --improving, treated with IV steroids today  change to oral in AM. Benadryl as needed --oral steroid tape on discharge  PMH drug abuse --denies recent use  Disposition Plan:  Discussion: severe reaction to poison ivy w/ substantial superinfection. Improving, but continues to need IV abx and steroids and close monitoring of response to tx.   Status is: Inpatient  Remains inpatient appropriate because:IV treatments appropriate due to intensity of illness or inability to take PO   Dispo: The patient is from: Home              Anticipated d/c is to: Home              Anticipated d/c date is: 2 days              Patient currently is not medically stable to d/c.  DVT prophylaxis: enoxaparin (LOVENOX) injection 40 mg Start: 06/03/20 1000 Code Status: Full Family Communication: none   Significant Hospital Events   . 7/25 admit   Consults:  . Wound care RN   Procedures:  .   Significant Diagnostic Tests:  Marland Kitchen    Micro  Data:  . 7/25 left leg wound >   Antimicrobials:  . Vancomycin 7/25 > . Zosyn 7/25 >   Interval History/Subjective  No pain. Went to the bathroom without any pain dizziness lightheadedness. No nausea no vomiting.  Oral intake adequate.  Objective   Vitals:  Vitals:   06/05/20 0504 06/05/20 1423  BP: 114/69 120/67  Pulse: 64 78  Resp: 16 18  Temp: 97.9 F (36.6 C) 98.7 F (37.1 C)  SpO2: 98% 98%    Exam: Constitutional:   . Appears calm and comfortable Respiratory:  . CTA bilaterally, no w/r/r.  . Respiratory effort normal.  Cardiovascular:  . RRR, no m/r/g . No left foot edema   Skin:  . Rash on right arm and back drying up, no new lesions . Rash LLE drying up, crusting gone, new skin visible Psychiatric:  . Mental status o Mood, affect appropriate  I have personally reviewed the following:   Today's Data  . No new data  Scheduled Meds: . cefdinir  300 mg Oral Q12H  . doxycycline  100 mg Oral Q12H  . enoxaparin (LOVENOX) injection  40 mg Subcutaneous Q24H  . [START ON 06/06/2020] predniSONE  50 mg Oral Q breakfast  . silver sulfADIAZINE   Topical BID   Continuous Infusions:   Principal Problem:   Lower extremity cellulitis Active Problems:   Contact dermatitis   Poison ivy  dermatitis   LOS: 3 days    Author:  Lynden Oxford, MD [facility] 06/05/2020  8:00 PM   To reach On-call, see care teams to locate the attending and reach out to them via [email or web address]. If 7PM-7AM, please contact night-coverage If you still have difficulty reaching the attending provider, please page the [facility] (Director on Call) for [facility] on amion for assistance.

## 2020-06-06 LAB — CBC
HCT: 45.9 % (ref 39.0–52.0)
Hemoglobin: 15.1 g/dL (ref 13.0–17.0)
MCH: 33 pg (ref 26.0–34.0)
MCHC: 32.9 g/dL (ref 30.0–36.0)
MCV: 100.4 fL — ABNORMAL HIGH (ref 80.0–100.0)
Platelets: 244 10*3/uL (ref 150–400)
RBC: 4.57 MIL/uL (ref 4.22–5.81)
RDW: 12.7 % (ref 11.5–15.5)
WBC: 7.5 10*3/uL (ref 4.0–10.5)
nRBC: 0 % (ref 0.0–0.2)

## 2020-06-06 LAB — BASIC METABOLIC PANEL
Anion gap: 8 (ref 5–15)
BUN: 10 mg/dL (ref 6–20)
CO2: 26 mmol/L (ref 22–32)
Calcium: 8.4 mg/dL — ABNORMAL LOW (ref 8.9–10.3)
Chloride: 105 mmol/L (ref 98–111)
Creatinine, Ser: 0.72 mg/dL (ref 0.61–1.24)
GFR calc Af Amer: >60 mL/min (ref >60)
GFR calc non Af Amer: >60 mL/min (ref >60)
Glucose, Bld: 104 mg/dL — ABNORMAL HIGH (ref 70–99)
Potassium: 3.7 mmol/L (ref 3.5–5.1)
Sodium: 139 mmol/L (ref 135–145)

## 2020-06-06 LAB — AEROBIC CULTURE W GRAM STAIN (SUPERFICIAL SPECIMEN)

## 2020-06-06 LAB — MAGNESIUM: Magnesium: 2 mg/dL (ref 1.7–2.4)

## 2020-06-06 MED ORDER — SILVER SULFADIAZINE 1 % EX CREA: TOPICAL_CREAM | Freq: Two times a day (BID) | CUTANEOUS | 0 refills | Status: DC

## 2020-06-06 MED ORDER — CEFDINIR 300 MG PO CAPS: 300.0000 mg | ORAL_CAPSULE | Freq: Two times a day (BID) | ORAL | 0 refills | Status: AC

## 2020-06-06 MED ORDER — PREDNISONE 10 MG PO TABS: ORAL_TABLET | ORAL | 0 refills | Status: DC

## 2020-06-06 MED ORDER — DOXYCYCLINE HYCLATE 100 MG PO TABS: 100.0000 mg | ORAL_TABLET | Freq: Two times a day (BID) | ORAL | 0 refills | Status: AC

## 2020-06-06 NOTE — TOC Transition Note (Signed)
Transition of Care [facility]) - CM/SW Discharge Note   Patient Details  Name: Gerald Harrison MRN: [id number] Date of Birth: Apr 11, 1986  [facility]) CM/SW Contact:  Ida Rogue, LCSW Phone Number: 06/06/2020, 10:10 AM   Clinical Narrative:   Patient who is d/cing today is in need of PCP appointment, Wound Clinic appointment and [facility] RN follow up.  I was able to make the first two appointments; have left a message for [facility] to request charity [facility] services in [place], patient's home.  Waiting for call back. TOC will continue to follow during the course of hospitalization.     Final next level of care: Home/Self Care Barriers to Discharge: No Barriers Identified   Patient Goals and CMS Choice        Discharge Placement                       Discharge Plan and Services                                     Social Determinants of Health (SDOH) Interventions     Readmission Risk Interventions No flowsheet data found.

## 2020-06-08 NOTE — Discharge Summary (Signed)
[facility] Discharge Summary   Patient: Gerald Harrison ZOX:[id number]  PCP: Patient, No Pcp Per  Date of admission: 06/02/2020   Date of discharge: 06/06/2020     Discharge Diagnoses:  Principal Problem:   Lower extremity cellulitis Active Problems:   Contact dermatitis   Poison ivy dermatitis  Admitted From: home Disposition:  Home   Recommendations for Outpatient Follow-up:  1. PCP: please follow up in 1 week 2. Follow up LABS/TEST:  none   Follow-up Information     [facility]              Follow up on 07/08/2020.   Why: Monday at1:15 to get your wounds checked Contact information: [address] [place] [phone]       [facility] Follow up on 06/19/2020.   Specialty: Internal Medicine Why: Wed at 9:20AM with the MD for your hospital follow up appointment Contact information: [address] [place] [phone]             Diet recommendation: Cardiac diet  Activity: The patient is advised to gradually reintroduce usual activities, as tolerated  Discharge Condition: stable  Code Status: Full code   History of present illness: As per the H and P dictated on admission, "Gerald Purlric Coyle  is a 34 y.o. male with a remote history of bipolar 1 disorder and polysubstance abuse, including alcohol and cocaine but he quit and ongoing tobacco, who presented to the emergency room with acute onset of left leg swelling with associated erythema, tenderness and pain with pustular eruptions.  He was playing with his friends kids a few days ago and believes that he was exposed to poison ivy and remembers having a significant reaction to it as a child.  Next day he started having rash in his leg that he has scratched and the rash extended to his upper extremities well as his back with significant pruritus.  He developed fever and chills today and extension was up  to 101.8 here.  He denied any nausea or vomiting or diarrhea.  No chest pain or dyspnea or cough or wheezing.  No new detergents, lotions, soaps or medications.  He stated that he does not take any medications and that he was diagnosed with bipolar disorder only when he was on drugs in the past that he discontinued.  Upon presentation to the emergency room, heart rate was 109 and later 115, temperature 101.8 and vital signs were otherwise within normal.  Labs revealed mild hyponatremia 134 and CBC was unremarkable.  COVID-19 PCR came back negative.  The patient was given 6 mg of IV morphine sulfate and p.o. Tylenol as well as IV vancomycin.  He will be admitted to a medical telemetry bed for further evaluation and management."  Hospital Course:  Summary of his active problems in the hospital is as following. Severe purulent and nonpurulent left lower extremity cellulitis, likely bacterial superinfection from poison ivy eruption. MRSA in the wound. The patient has developing impetigo. No evidence of sepsis improved but still significant LLE involvement.  Treated with IV vancomycin and Zosyn. MRSA growing in the superficial culture. We will transition to oral doxycycline. Patient will require ongoing wound care outside of the hospital. Referral made Cleanse crusted scabbed lesions with NS and pat dry. Apply silvadene cream to open wounds. Cover with dry gauze, kerlix and tape. Change twice daily  Allergic dermatitis due to Poison ivy  exposure  improving, treated with IV steroids change to oral  Benadryl as needed oral steroid tape on discharge  PMH drug abuse --denies recent use  On the day of the discharge the patient's vitals were stable, and no other acute medical condition were reported by patient. the patient was felt safe to be discharge at Home with no therapy needed on discharge.  Consultants: none Procedures: none  Discharge Exam: General: Appear in no distress, posterior  thigh and calf skin redness Rash; Oral Mucosa Clear, moist. Cardiovascular: S1 and S2 Present, no Murmur, Respiratory: normal respiratory effort, Bilateral Air entry present and no Crackles, no wheezes Abdomen: Bowel Sound present, Soft and no tenderness, no hernia Extremities: no Pedal edema, no calf tenderness Neurology: alert and oriented to time, place, and person affect appropriate.  Filed Weights   06/02/20 2052 06/02/20 2126  Weight: 79.4 kg 81.6 kg   Vitals:   06/05/20 2201 06/06/20 0502  BP: (!) 118/61 109/74  Pulse: 78 68  Resp: 17 18  Temp: 98.3 F (36.8 C) 97.7 F (36.5 C)  SpO2: 96% 97%    DISCHARGE MEDICATION: Allergies as of 06/06/2020      Reactions   Nsaids       Medication List    STOP taking these medications   hydrocortisone cream 1 %     TAKE these medications   bacitracin ointment Apply 1 application topically 2 (two) times daily. Apply to left forearm burn   cefdinir 300 MG capsule Commonly known as: OMNICEF Take 1 capsule (300 mg total) by mouth 2 (two) times daily for 5 days.   doxycycline 100 MG tablet Commonly known as: VIBRA-TABS Take 1 tablet (100 mg total) by mouth 2 (two) times daily for 5 days.   predniSONE 10 MG tablet Commonly known as: DELTASONE Take 50mg  daily for 3days,Take 40mg  daily for 3days,Take 30mg  daily for 3days,Take 20mg  daily for 3days,Take 10mg  daily for 3days, then stop   silver sulfADIAZINE 1 % cream Commonly known as: SILVADENE Apply topically 2 (two) times daily.            Discharge Care Instructions  (From admission, onward)         Start     Ordered   06/06/20 0000  Discharge wound care:       Comments: Cleanse crusted scabbed lesions with NS and pat dry.  Apply silvadene cream to open wounds. Cover with dry gauze, kerlix and tape. Change twice daily   06/06/20 0927         Allergies  Allergen Reactions  . Nsaids    Discharge Instructions    Diet - low sodium heart healthy   Complete  by: As directed    Discharge wound care:   Complete by: As directed    Cleanse crusted scabbed lesions with NS and pat dry.  Apply silvadene cream to open wounds. Cover with dry gauze, kerlix and tape. Change twice daily   Increase activity slowly   Complete by: As directed       The results of significant diagnostics from this hospitalization (including imaging, microbiology, ancillary and laboratory) are listed below for reference.    Significant Diagnostic Studies: No results found.  Microbiology: Recent Results (from the past 240 hour(s))  SARS Coronavirus 2 by RT PCR (hospital order, performed in [facility] hospital lab) Nasopharyngeal Nasopharyngeal Swab     Status: None   Collection Time: 06/02/20  8:53 PM   Specimen: Nasopharyngeal Swab  Result Value Ref  Range Status   SARS Coronavirus 2 NEGATIVE NEGATIVE Final    Comment: (NOTE) SARS-CoV-2 target nucleic acids are NOT DETECTED.  The SARS-CoV-2 RNA is generally detectable in upper and lower respiratory specimens during the acute phase of infection. The lowest concentration of SARS-CoV-2 viral copies this assay can detect is 250 copies / mL. A negative result does not preclude SARS-CoV-2 infection and should not be used as the sole basis for treatment or other patient management decisions.  A negative result may occur with improper specimen collection / handling, submission of specimen other than nasopharyngeal swab, presence of viral mutation(s) within the areas targeted by this assay, and inadequate number of viral copies (<250 copies / mL). A negative result must be combined with clinical observations, patient history, and epidemiological information.  Fact Sheet for Patients:   BoilerBrush.com.cy  Fact Sheet for Healthcare Providers: https://pope.com/  This test is not yet approved or  cleared by the [place] FDA and has been authorized for detection and/or  diagnosis of SARS-CoV-2 by FDA under an Emergency Use Authorization (EUA).  This EUA will remain in effect (meaning this test can be used) for the duration of the COVID-19 declaration under Section 564(b)(1) of the Act, 21 U.S.C. section 360bbb-3(b)(1), unless the authorization is terminated or revoked sooner.  Performed at [facility], [address] [place], [place] [address]   Aerobic Culture (superficial specimen)     Status: None   Collection Time: 06/02/20 11:19 PM   Specimen: Wound  Result Value Ref Range Status   Specimen Description WOUND LEFT LEG  Final   Special Requests NONE  Final   Gram Stain   Final    FEW WBC PRESENT, PREDOMINANTLY PMN ABUNDANT GRAM POSITIVE COCCI IN PAIRS IN CLUSTERS RARE GRAM POSITIVE RODS RARE GRAM NEGATIVE RODS Performed at [facility], [address], [place], [place] [address]    Culture   Final    MODERATE METHICILLIN RESISTANT STAPHYLOCOCCUS AUREUS   Report Status 06/06/2020 FINAL  Final   Organism ID, Bacteria METHICILLIN RESISTANT STAPHYLOCOCCUS AUREUS  Final      Susceptibility   Methicillin resistant staphylococcus aureus - MIC*    CIPROFLOXACIN 4 RESISTANT Resistant     ERYTHROMYCIN >=8 RESISTANT Resistant     GENTAMICIN <=0.5 SENSITIVE Sensitive     OXACILLIN >=4 RESISTANT Resistant     TETRACYCLINE <=1 SENSITIVE Sensitive     VANCOMYCIN <=0.5 SENSITIVE Sensitive     TRIMETH/SULFA 80 RESISTANT Resistant     CLINDAMYCIN >=8 RESISTANT Resistant     RIFAMPIN <=0.5 SENSITIVE Sensitive     Inducible Clindamycin NEGATIVE Sensitive     * MODERATE METHICILLIN RESISTANT STAPHYLOCOCCUS AUREUS     Labs: CBC: Recent Labs  Lab 06/02/20 2053 06/03/20 0320 06/06/20 0314  WBC 9.9 9.0 7.5  NEUTROABS 7.7  --   --   HGB 14.5 14.3 15.1  HCT 41.3 41.5 45.9  MCV 97.9 98.3 100.4*  PLT 196 196 244   Basic Metabolic Panel: Recent Labs  Lab 06/02/20 2053 06/03/20 0320 06/05/20 0333 06/06/20 0314  NA 134*  137 141 139  K 4.0 3.9 3.3* 3.7  CL 98 101 107 105  CO2 26 27 26 26   GLUCOSE 104* 177* 135* 104*  BUN 11 10 11 10   CREATININE 0.94 0.94 0.77 0.72  CALCIUM 8.6* 8.5* 8.4* 8.4*  MG  --   --   --  2.0   Time spent: 35 minutes  Signed:   Allena Katz  [facility] 06/06/2020 4:52 PM

## 2020-06-19 ENCOUNTER — Inpatient Hospital Stay: Payer: Self-pay | Admitting: Family Medicine

## 2020-07-08 ENCOUNTER — Encounter (HOSPITAL_BASED_OUTPATIENT_CLINIC_OR_DEPARTMENT_OTHER): Payer: Self-pay | Admitting: Internal Medicine
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
## 2021-07-15 ENCOUNTER — Emergency Department (HOSPITAL_COMMUNITY): Admission: EM | Admit: 2021-07-15 | Discharge: 2021-07-15 | Discharge status: Against Medical Advice | Payer: Self-pay

## 2021-07-15 ENCOUNTER — Emergency Department (HOSPITAL_COMMUNITY): Payer: Self-pay

## 2021-07-15 ENCOUNTER — Other Ambulatory Visit: Payer: Self-pay

## 2021-07-15 ENCOUNTER — Emergency Department (HOSPITAL_COMMUNITY)
Admission: EM | Admit: 2021-07-15 | Discharge: 2021-07-15 | Disposition: A | Discharge status: Home / Self Care | Payer: Self-pay | Attending: Emergency Medicine | Admitting: Emergency Medicine

## 2021-07-15 ENCOUNTER — Encounter (HOSPITAL_COMMUNITY): Payer: Self-pay

## 2021-07-15 DIAGNOSIS — M549 Dorsalgia, unspecified: Secondary | ICD-10-CM | POA: Insufficient documentation

## 2021-07-15 DIAGNOSIS — R252 Cramp and spasm: Secondary | ICD-10-CM | POA: Insufficient documentation

## 2021-07-15 DIAGNOSIS — M791 Myalgia, unspecified site: Secondary | ICD-10-CM

## 2021-07-15 DIAGNOSIS — F17210 Nicotine dependence, cigarettes, uncomplicated: Secondary | ICD-10-CM | POA: Insufficient documentation

## 2021-07-15 DIAGNOSIS — R4182 Altered mental status, unspecified: Secondary | ICD-10-CM | POA: Insufficient documentation

## 2021-07-15 DIAGNOSIS — R519 Headache, unspecified: Secondary | ICD-10-CM | POA: Insufficient documentation

## 2021-07-15 DIAGNOSIS — F1910 Other psychoactive substance abuse, uncomplicated: Secondary | ICD-10-CM

## 2021-07-15 LAB — RAPID URINE DRUG SCREEN, HOSP PERFORMED
Amphetamines: POSITIVE — AB
Barbiturates: NOT DETECTED
Benzodiazepines: NOT DETECTED
Cocaine: POSITIVE — AB
Opiates: NOT DETECTED
Tetrahydrocannabinol: POSITIVE — AB

## 2021-07-15 LAB — COMPREHENSIVE METABOLIC PANEL
ALT: 49 U/L — ABNORMAL HIGH (ref 0–44)
AST: 53 U/L — ABNORMAL HIGH (ref 15–41)
Albumin: 5 g/dL (ref 3.5–5.0)
Alkaline Phosphatase: 62 U/L (ref 38–126)
Anion gap: 10 (ref 5–15)
BUN: 10 mg/dL (ref 6–20)
CO2: 23 mmol/L (ref 22–32)
Calcium: 10 mg/dL (ref 8.9–10.3)
Chloride: 101 mmol/L (ref 98–111)
Creatinine, Ser: 0.89 mg/dL (ref 0.61–1.24)
GFR, Estimated: >60 mL/min (ref >60)
Glucose, Bld: 113 mg/dL — ABNORMAL HIGH (ref 70–99)
Potassium: 3.1 mmol/L — ABNORMAL LOW (ref 3.5–5.1)
Sodium: 134 mmol/L — ABNORMAL LOW (ref 135–145)
Total Bilirubin: 0.5 mg/dL (ref 0.3–1.2)
Total Protein: 8.4 g/dL — ABNORMAL HIGH (ref 6.5–8.1)

## 2021-07-15 LAB — CBC WITH DIFFERENTIAL/PLATELET
Abs Immature Granulocytes: 0.09 10*3/uL — ABNORMAL HIGH (ref 0.00–0.07)
Basophils Absolute: 0 10*3/uL (ref 0.0–0.1)
Basophils Relative: 0 %
Eosinophils Absolute: 0 10*3/uL (ref 0.0–0.5)
Eosinophils Relative: 0 %
HCT: 45.2 % (ref 39.0–52.0)
Hemoglobin: 15.8 g/dL (ref 13.0–17.0)
Immature Granulocytes: 1 %
Lymphocytes Relative: 4 %
Lymphs Abs: 0.3 10*3/uL — ABNORMAL LOW (ref 0.7–4.0)
MCH: 33.2 pg (ref 26.0–34.0)
MCHC: 35 g/dL (ref 30.0–36.0)
MCV: 95 fL (ref 80.0–100.0)
Monocytes Absolute: 0.6 10*3/uL (ref 0.1–1.0)
Monocytes Relative: 8 %
Neutro Abs: 6 10*3/uL (ref 1.7–7.7)
Neutrophils Relative %: 87 %
Platelets: 193 10*3/uL (ref 150–400)
RBC: 4.76 MIL/uL (ref 4.22–5.81)
RDW: 11.9 % (ref 11.5–15.5)
WBC: 7 10*3/uL (ref 4.0–10.5)
nRBC: 0 % (ref 0.0–0.2)

## 2021-07-15 LAB — LACTIC ACID, PLASMA: Lactic Acid, Venous: 2.2 mmol/L (ref 0.5–1.9)

## 2021-07-15 LAB — C-REACTIVE PROTEIN: CRP: 0.6 mg/dL (ref <1.0)

## 2021-07-15 LAB — ETHANOL: Alcohol, Ethyl (B): <10 mg/dL (ref <10)

## 2021-07-15 LAB — SEDIMENTATION RATE: Sed Rate: 2 mm/hr (ref 0–16)

## 2021-07-15 LAB — CK: Total CK: 241 U/L (ref 49–397)

## 2021-07-15 MED ORDER — ONDANSETRON 4 MG PO TBDP: 4.0000 mg | ORAL_TABLET | Freq: Three times a day (TID) | ORAL | 0 refills | Status: AC | PRN

## 2021-07-15 MED ORDER — METHOCARBAMOL 500 MG PO TABS: 500.0000 mg | ORAL_TABLET | Freq: Two times a day (BID) | ORAL | 0 refills | Status: AC

## 2021-07-15 MED ORDER — SODIUM CHLORIDE 0.9 % IV BOLUS
1000.0000 mL | Freq: Once | INTRAVENOUS | Status: AC
  Administered 2021-07-15: 1000 mL via INTRAVENOUS

## 2021-07-15 MED ORDER — DIAZEPAM 5 MG PO TABS
5.0000 mg | ORAL_TABLET | Freq: Once | ORAL | Status: AC
  Administered 2021-07-15: 5 mg via ORAL
  Filled 2021-07-15: qty 1

## 2021-07-15 MED ORDER — MAGNESIUM OXIDE -MG SUPPLEMENT 400 (240 MG) MG PO TABS
800.0000 mg | ORAL_TABLET | Freq: Once | ORAL | Status: AC
  Administered 2021-07-15: 800 mg via ORAL
  Filled 2021-07-15: qty 2

## 2021-07-15 MED ORDER — LORAZEPAM 2 MG/ML IJ SOLN
2.0000 mg | Freq: Once | INTRAMUSCULAR | Status: AC
  Administered 2021-07-15: 2 mg via INTRAVENOUS
  Filled 2021-07-15: qty 1

## 2021-07-15 MED ORDER — ACETAMINOPHEN 500 MG PO TABS
1000.0000 mg | ORAL_TABLET | Freq: Once | ORAL | Status: AC
  Administered 2021-07-15: 1000 mg via ORAL
  Filled 2021-07-15: qty 2

## 2021-07-15 MED ORDER — POTASSIUM CHLORIDE CRYS ER 20 MEQ PO TBCR
40.0000 meq | EXTENDED_RELEASE_TABLET | Freq: Once | ORAL | Status: AC
  Administered 2021-07-15: 40 meq via ORAL
  Filled 2021-07-15: qty 2

## 2021-07-15 NOTE — ED Notes (Signed)
Patient found laying on the floor. Patient state he just wanted to be on the floor. Patient return back in bed at this time.

## 2021-07-15 NOTE — ED Notes (Signed)
Pt not cooperating, repeatedly removing socks, and not following staff instruction.

## 2021-07-15 NOTE — ED Notes (Addendum)
Pt continuing to pull off cardiac leads, pulled out IV and refuses to let us get second lactic. PA notified.
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
## 2021-07-15 NOTE — ED Triage Notes (Signed)
Per EMS- patient c/o lower back pain, leg cramping, and a headache since yesterday.

## 2021-07-15 NOTE — ED Notes (Signed)
CRITICAL VALUE STICKER  CRITICAL VALUE: lactic acid 2.2  MD NOTIFIED: Dixon MD   TIME OF NOTIFICATION: 1226

## 2021-07-15 NOTE — ED Notes (Signed)
Pt placed on cardiac monitor 

## 2021-07-15 NOTE — ED Notes (Signed)
Patient has a WBC of 1.4. Haviland,MD notified.

## 2021-07-15 NOTE — ED Notes (Signed)
Pt refused his CT. Pt would not stay still and started cussing at CT staff. PA made aware.

## 2021-07-15 NOTE — ED Provider Notes (Signed)
Care assumed from Gerald Anis, PA-C at shift change pending metabolism of possible drugs. See her note for full HPI.  In short, patient is a 35 year old male who presents to the ED due to back pain.  Patient told the previous provider his "whole back" started hurting him yesterday. He also endorsed a headache and muscle cramps.  At shift change, patient had already been given a few doses of Ativan, so patient drowsy. Per chart review, patient has a history of heroin abuse. Previous team felt like patient was withdrawing from possible heroin. CT head was attempted; however, patient unable to sit still in scanner. Plan from previous provider is to observe for 2-3 hours. If AMS improves, no further work-up. If still continues to be altered, attempt CT head.   ED Course/Procedures    Results for orders placed or performed during the hospital encounter of 07/15/21 (from the past 24 hour(s))  CBC with Differential     Status: Abnormal   Collection Time: 07/15/21 11:38 AM  Result Value Ref Range   WBC 7.0 4.0 - 10.5 K/uL   RBC 4.76 4.22 - 5.81 MIL/uL   Hemoglobin 15.8 13.0 - 17.0 g/dL   HCT 38.1 01.7 - 51.0 %   MCV 95.0 80.0 - 100.0 fL   MCH 33.2 26.0 - 34.0 pg   MCHC 35.0 30.0 - 36.0 g/dL   RDW 25.8 52.7 - 78.2 %   Platelets 193 150 - 400 K/uL   nRBC 0.0 0.0 - 0.2 %   Neutrophils Relative % 87 %   Neutro Abs 6.0 1.7 - 7.7 K/uL   Lymphocytes Relative 4 %   Lymphs Abs 0.3 (L) 0.7 - 4.0 K/uL   Monocytes Relative 8 %   Monocytes Absolute 0.6 0.1 - 1.0 K/uL   Eosinophils Relative 0 %   Eosinophils Absolute 0.0 0.0 - 0.5 K/uL   Basophils Relative 0 %   Basophils Absolute 0.0 0.0 - 0.1 K/uL   Immature Granulocytes 1 %   Abs Immature Granulocytes 0.09 (H) 0.00 - 0.07 K/uL  Comprehensive metabolic panel     Status: Abnormal   Collection Time: 07/15/21 11:38 AM  Result Value Ref Range   Sodium 134 (L) 135 - 145 mmol/L   Potassium 3.1 (L) 3.5 - 5.1 mmol/L   Chloride 101 98 - 111 mmol/L   CO2  23 22 - 32 mmol/L   Glucose, Bld 113 (H) 70 - 99 mg/dL   BUN 10 6 - 20 mg/dL   Creatinine, Ser 4.23 0.61 - 1.24 mg/dL   Calcium 53.6 8.9 - 14.4 mg/dL   Total Protein 8.4 (H) 6.5 - 8.1 g/dL   Albumin 5.0 3.5 - 5.0 g/dL   AST 53 (H) 15 - 41 U/L   ALT 49 (H) 0 - 44 U/L   Alkaline Phosphatase 62 38 - 126 U/L   Total Bilirubin 0.5 0.3 - 1.2 mg/dL   GFR, Estimated >31 >54 mL/min   Anion gap 10 5 - 15  Lactic acid, plasma     Status: Abnormal   Collection Time: 07/15/21 11:38 AM  Result Value Ref Range   Lactic Acid, Venous 2.2 (HH) 0.5 - 1.9 mmol/L  CK     Status: None   Collection Time: 07/15/21 11:38 AM  Result Value Ref Range   Total CK 241 49 - 397 U/L  Ethanol     Status: None   Collection Time: 07/15/21 11:39 AM  Result Value Ref Range   Alcohol, Ethyl (B) <  10 <10 mg/dL  Sedimentation rate     Status: None   Collection Time: 07/15/21 11:39 AM  Result Value Ref Range   Sed Rate 2 0 - 16 mm/hr  C-reactive protein     Status: None   Collection Time: 07/15/21 11:39 AM  Result Value Ref Range   CRP 0.6 <1.0 mg/dL  Urine rapid drug screen (hosp performed)     Status: Abnormal   Collection Time: 07/15/21  2:22 PM  Result Value Ref Range   Opiates NONE DETECTED NONE DETECTED   Cocaine POSITIVE (A) NONE DETECTED   Benzodiazepines NONE DETECTED NONE DETECTED   Amphetamines POSITIVE (A) NONE DETECTED   Tetrahydrocannabinol POSITIVE (A) NONE DETECTED   Barbiturates NONE DETECTED NONE DETECTED    Procedures  MDM  Care assumed from Gerald Anis, PA-C at shift change pending metabolism of suspected drugs.  See her note for full MDM.  35 year old male presents to the ED due to myalgias and numerous complaints.  Previous team suspected patient withdrawing from unknown substance.  Reviewed.  Patient has a history of polysubstance abuse.  Patient initially stated he had severe back pain.  Bilateral lower extremities neurovascularly intact.  CRP RP within normal limits.  No evidence of  fever.  Low suspicion for infectious etiology of back pain.  Suspicion for cauda equina or central cord compression.  CBC without evidence of leukocytosis.  Normal hemoglobin.  CK normal.  Ethanol normal.  UDS positive for cocaine, amphetamines, and THC.  3:44 PM reassessed patient at bedside. He is resting comfortably in bed  4:37 PM reassessed patient at bedside.  Patient resting comfortably in bed.  Patient arouses to painful stimuli. Patient sedated from numerous doses of ativan.  5:57 PM reassessed patient at bedside.  Patient more arousable.  Patient states he presented to the ED due to "pain all over".  Denies any drug use.   6:25 PM Called to bedside given patient is not cooperating with nursing staff. Patient refused repeat lactic acid. Patient ambulating around the room without difficulty. Dr. Particia Nearing evaluated patient at bedside and agrees patient is stable for discharge. Patient able to speak clearly and coherently. Suspect drug intoxication upon arrival. No midline thoracic or lumbar tenderness.  Bilateral lower extremities neurovascularly intact.  Low suspicion for cauda equina or central cord compression.  No infectious symptoms to suggest infectious etiology. Patient discharged with Zofran and Robaxin.  Advised patient to follow-up with PCP within 1 week. Strict ED precautions discussed with patient. Patient states understanding and agrees to plan. Patient discharged home in no acute distress and stable vitals            Jesusita Oka 07/15/21 1831    Jacalyn Lefevre, MD 07/15/21 [phone]

## 2021-07-15 NOTE — ED Notes (Signed)
Pt is agitated because of back pain.   Not able to get a good B/P because of movement.

## 2021-07-15 NOTE — Discharge Instructions (Addendum)
It was a pleasure taking care of you today. Your labs were reassuring. CT head did not show any abnormalities. You may be withdrawing from drugs. I am sending you home with nausea medication and muscle relaxers. Follow-up with PCP within the next week for further evaluation. Return to the ER for new or worsening symptoms.

## 2021-07-16 ENCOUNTER — Telehealth (HOSPITAL_BASED_OUTPATIENT_CLINIC_OR_DEPARTMENT_OTHER): Payer: Self-pay

## 2021-07-16 LAB — BLOOD CULTURE ID PANEL (REFLEXED) - BCID2

## 2021-07-16 NOTE — Telephone Encounter (Signed)
This RN Made aware of + Blood cultures in 1/4 bottles with staph species, ED MD who saw him yesterday Durwin Nora, MD, advises possible contaminant, no need for follow up.

## 2021-07-18 LAB — CULTURE, BLOOD (ROUTINE X 2): Special Requests: ADEQUATE

## 2021-07-20 LAB — CULTURE, BLOOD (ROUTINE X 2)
Culture: NO GROWTH
Special Requests: ADEQUATE
